# Patient Record
Sex: Female | Born: 1960 | Race: Black or African American | Hispanic: No | Smoking: Never smoker
Health system: Southern US, Community
[De-identification: ages and names within clinical notes are randomized; demographics above are authoritative.]

## PROBLEM LIST (undated history)

## (undated) DIAGNOSIS — E119 Type 2 diabetes mellitus without complications: Secondary | ICD-10-CM

## (undated) DIAGNOSIS — J45909 Unspecified asthma, uncomplicated: Secondary | ICD-10-CM

## (undated) DIAGNOSIS — T7840XA Allergy, unspecified, initial encounter: Secondary | ICD-10-CM

## (undated) DIAGNOSIS — D649 Anemia, unspecified: Secondary | ICD-10-CM

## (undated) DIAGNOSIS — R011 Cardiac murmur, unspecified: Secondary | ICD-10-CM

## (undated) DIAGNOSIS — K219 Gastro-esophageal reflux disease without esophagitis: Secondary | ICD-10-CM

## (undated) DIAGNOSIS — I10 Essential (primary) hypertension: Secondary | ICD-10-CM

## (undated) HISTORY — PX: TUBAL LIGATION: SHX77

## (undated) HISTORY — DX: Allergy, unspecified, initial encounter: T78.40XA

---

## 1993-12-03 HISTORY — PX: TUBAL LIGATION: SHX77

## 2003-10-22 ENCOUNTER — Other Ambulatory Visit: Payer: Self-pay

## 2004-12-18 ENCOUNTER — Ambulatory Visit: Payer: Self-pay | Admitting: Family Medicine

## 2005-10-01 ENCOUNTER — Emergency Department: Payer: Self-pay | Admitting: Emergency Medicine

## 2006-01-07 ENCOUNTER — Ambulatory Visit: Payer: Self-pay | Admitting: Psychiatry

## 2006-01-09 ENCOUNTER — Ambulatory Visit: Payer: Self-pay | Admitting: Family Medicine

## 2007-04-09 ENCOUNTER — Ambulatory Visit: Payer: Self-pay | Admitting: Family Medicine

## 2007-06-12 ENCOUNTER — Ambulatory Visit: Payer: Self-pay | Admitting: Family Medicine

## 2007-07-29 ENCOUNTER — Ambulatory Visit: Payer: Self-pay | Admitting: Family Medicine

## 2008-09-22 ENCOUNTER — Ambulatory Visit: Payer: Self-pay | Admitting: Family Medicine

## 2008-10-28 ENCOUNTER — Emergency Department: Payer: Self-pay | Admitting: Emergency Medicine

## 2009-02-02 ENCOUNTER — Ambulatory Visit: Payer: Self-pay | Admitting: Family Medicine

## 2010-06-03 ENCOUNTER — Emergency Department: Payer: Self-pay | Admitting: Emergency Medicine

## 2010-09-16 ENCOUNTER — Emergency Department: Payer: Self-pay | Admitting: Emergency Medicine

## 2010-11-28 ENCOUNTER — Ambulatory Visit: Payer: Self-pay | Admitting: Obstetrics & Gynecology

## 2010-11-30 ENCOUNTER — Ambulatory Visit: Payer: Self-pay | Admitting: Obstetrics & Gynecology

## 2010-12-05 LAB — PATHOLOGY REPORT

## 2011-12-04 HISTORY — PX: UTERINE FIBROID SURGERY: SHX826

## 2012-03-27 ENCOUNTER — Ambulatory Visit: Payer: Self-pay | Admitting: Family Medicine

## 2013-06-01 ENCOUNTER — Ambulatory Visit: Payer: Self-pay | Admitting: Family Medicine

## 2013-07-05 ENCOUNTER — Emergency Department: Payer: Self-pay | Admitting: Emergency Medicine

## 2014-03-05 ENCOUNTER — Emergency Department: Payer: Self-pay | Admitting: Emergency Medicine

## 2014-03-05 LAB — COMPREHENSIVE METABOLIC PANEL
ALBUMIN: 3.9 g/dL (ref 3.4–5.0)
ALK PHOS: 251 U/L — AB
ANION GAP: 7 (ref 7–16)
AST: 823 U/L — AB (ref 15–37)
BUN: 6 mg/dL — ABNORMAL LOW (ref 7–18)
Bilirubin,Total: 4.1 mg/dL — ABNORMAL HIGH (ref 0.2–1.0)
CALCIUM: 9.3 mg/dL (ref 8.5–10.1)
Chloride: 102 mmol/L (ref 98–107)
Co2: 26 mmol/L (ref 21–32)
Creatinine: 0.84 mg/dL (ref 0.60–1.30)
Glucose: 122 mg/dL — ABNORMAL HIGH (ref 65–99)
OSMOLALITY: 269 (ref 275–301)
Potassium: 3.8 mmol/L (ref 3.5–5.1)
SGPT (ALT): 1105 U/L — ABNORMAL HIGH (ref 12–78)
Sodium: 135 mmol/L — ABNORMAL LOW (ref 136–145)
Total Protein: 8.2 g/dL (ref 6.4–8.2)

## 2014-03-05 LAB — URINALYSIS, COMPLETE
BACTERIA: NONE SEEN
Blood: NEGATIVE
Glucose,UR: NEGATIVE mg/dL (ref 0–75)
Ketone: NEGATIVE
Leukocyte Esterase: NEGATIVE
Nitrite: NEGATIVE
Ph: 5 (ref 4.5–8.0)
Protein: 30
RBC,UR: 2 /HPF (ref 0–5)
SPECIFIC GRAVITY: 1.025 (ref 1.003–1.030)
Squamous Epithelial: 8
WBC UR: 8 /HPF (ref 0–5)

## 2014-03-05 LAB — CBC WITH DIFFERENTIAL/PLATELET
BASOS PCT: 0.3 %
Basophil #: 0 10*3/uL (ref 0.0–0.1)
EOS ABS: 0.2 10*3/uL (ref 0.0–0.7)
Eosinophil %: 3 %
HCT: 42.9 % (ref 35.0–47.0)
HGB: 13.8 g/dL (ref 12.0–16.0)
LYMPHS ABS: 0.6 10*3/uL — AB (ref 1.0–3.6)
Lymphocyte %: 7.5 %
MCH: 27.6 pg (ref 26.0–34.0)
MCHC: 32.3 g/dL (ref 32.0–36.0)
MCV: 85 fL (ref 80–100)
Monocyte #: 0.4 x10 3/mm (ref 0.2–0.9)
Monocyte %: 4.5 %
NEUTROS ABS: 6.9 10*3/uL — AB (ref 1.4–6.5)
Neutrophil %: 84.7 %
Platelet: 222 10*3/uL (ref 150–440)
RBC: 5.02 10*6/uL (ref 3.80–5.20)
RDW: 13.8 % (ref 11.5–14.5)
WBC: 8.2 10*3/uL (ref 3.6–11.0)

## 2014-03-05 LAB — PREGNANCY, URINE: PREGNANCY TEST, URINE: NEGATIVE m[IU]/mL

## 2014-03-05 LAB — LIPASE, BLOOD: Lipase: 211 U/L (ref 73–393)

## 2014-03-06 ENCOUNTER — Encounter (HOSPITAL_COMMUNITY): Payer: Self-pay | Admitting: *Deleted

## 2014-03-06 ENCOUNTER — Inpatient Hospital Stay (HOSPITAL_COMMUNITY)
Admission: EM | Admit: 2014-03-06 | Discharge: 2014-03-08 | DRG: 418 | Disposition: A | Discharge status: Home / Self Care | Payer: Medicaid Other | Source: Other Acute Inpatient Hospital | Attending: Internal Medicine | Admitting: Internal Medicine

## 2014-03-06 DIAGNOSIS — K8050 Calculus of bile duct without cholangitis or cholecystitis without obstruction: Secondary | ICD-10-CM | POA: Diagnosis present

## 2014-03-06 DIAGNOSIS — K810 Acute cholecystitis: Secondary | ICD-10-CM

## 2014-03-06 DIAGNOSIS — K8043 Calculus of bile duct with acute cholecystitis with obstruction: Principal | ICD-10-CM | POA: Diagnosis present

## 2014-03-06 DIAGNOSIS — K8020 Calculus of gallbladder without cholecystitis without obstruction: Secondary | ICD-10-CM

## 2014-03-06 DIAGNOSIS — Z79899 Other long term (current) drug therapy: Secondary | ICD-10-CM

## 2014-03-06 DIAGNOSIS — J45901 Unspecified asthma with (acute) exacerbation: Secondary | ICD-10-CM

## 2014-03-06 DIAGNOSIS — I152 Hypertension secondary to endocrine disorders: Secondary | ICD-10-CM | POA: Diagnosis present

## 2014-03-06 DIAGNOSIS — K219 Gastro-esophageal reflux disease without esophagitis: Secondary | ICD-10-CM | POA: Diagnosis present

## 2014-03-06 DIAGNOSIS — R7402 Elevation of levels of lactic acid dehydrogenase (LDH): Secondary | ICD-10-CM

## 2014-03-06 DIAGNOSIS — R740 Nonspecific elevation of levels of transaminase and lactic acid dehydrogenase [LDH]: Secondary | ICD-10-CM

## 2014-03-06 DIAGNOSIS — R7401 Elevation of levels of liver transaminase levels: Secondary | ICD-10-CM | POA: Diagnosis present

## 2014-03-06 DIAGNOSIS — K8040 Calculus of bile duct with cholecystitis, unspecified, without obstruction: Secondary | ICD-10-CM

## 2014-03-06 DIAGNOSIS — I10 Essential (primary) hypertension: Secondary | ICD-10-CM | POA: Diagnosis present

## 2014-03-06 DIAGNOSIS — K8051 Calculus of bile duct without cholangitis or cholecystitis with obstruction: Secondary | ICD-10-CM | POA: Diagnosis present

## 2014-03-06 HISTORY — DX: Unspecified asthma, uncomplicated: J45.909

## 2014-03-06 HISTORY — DX: Gastro-esophageal reflux disease without esophagitis: K21.9

## 2014-03-06 HISTORY — DX: Essential (primary) hypertension: I10

## 2014-03-06 HISTORY — DX: Cardiac murmur, unspecified: R01.1

## 2014-03-06 HISTORY — DX: Anemia, unspecified: D64.9

## 2014-03-06 LAB — COMPREHENSIVE METABOLIC PANEL
ALBUMIN: 3.2 g/dL — AB (ref 3.5–5.2)
ALK PHOS: 208 U/L — AB (ref 39–117)
ALT: 808 U/L — ABNORMAL HIGH (ref 0–35)
AST: 529 U/L — AB (ref 0–37)
BILIRUBIN TOTAL: 4 mg/dL — AB (ref 0.3–1.2)
BUN: 5 mg/dL — ABNORMAL LOW (ref 6–23)
CHLORIDE: 103 meq/L (ref 96–112)
CO2: 21 meq/L (ref 19–32)
Calcium: 9.3 mg/dL (ref 8.4–10.5)
Creatinine, Ser: 0.59 mg/dL (ref 0.50–1.10)
GFR calc Af Amer: >90 mL/min (ref >90)
GFR calc non Af Amer: >90 mL/min (ref >90)
Glucose, Bld: 109 mg/dL — ABNORMAL HIGH (ref 70–99)
Potassium: 4 mEq/L (ref 3.7–5.3)
Sodium: 140 mEq/L (ref 137–147)
Total Protein: 6.8 g/dL (ref 6.0–8.3)

## 2014-03-06 LAB — CBC
HEMATOCRIT: 39 % (ref 36.0–46.0)
Hemoglobin: 12.9 g/dL (ref 12.0–15.0)
MCH: 28.1 pg (ref 26.0–34.0)
MCHC: 33.1 g/dL (ref 30.0–36.0)
MCV: 85 fL (ref 78.0–100.0)
PLATELETS: 213 10*3/uL (ref 150–400)
RBC: 4.59 MIL/uL (ref 3.87–5.11)
RDW: 13.9 % (ref 11.5–15.5)
WBC: 5.2 10*3/uL (ref 4.0–10.5)

## 2014-03-06 LAB — HEPATITIS B SURFACE ANTIGEN: Hepatitis B Surface Ag: NEGATIVE

## 2014-03-06 LAB — LIPASE, BLOOD: Lipase: 63 U/L — ABNORMAL HIGH (ref 11–59)

## 2014-03-06 LAB — HEPATITIS C ANTIBODY: HCV Ab: NEGATIVE

## 2014-03-06 MED ORDER — MONTELUKAST SODIUM 10 MG PO TABS
10.0000 mg | ORAL_TABLET | Freq: Every day | ORAL | Status: DC
  Administered 2014-03-06 – 2014-03-07 (×2): 10 mg via ORAL
  Filled 2014-03-06 (×3): qty 1

## 2014-03-06 MED ORDER — TRAZODONE HCL 50 MG PO TABS
50.0000 mg | ORAL_TABLET | Freq: Every day | ORAL | Status: DC
  Administered 2014-03-06: 50 mg via ORAL
  Filled 2014-03-06 (×3): qty 1

## 2014-03-06 MED ORDER — CIPROFLOXACIN IN D5W 400 MG/200ML IV SOLN
400.0000 mg | Freq: Two times a day (BID) | INTRAVENOUS | Status: DC
  Administered 2014-03-06 – 2014-03-08 (×4): 400 mg via INTRAVENOUS
  Filled 2014-03-06 (×6): qty 200

## 2014-03-06 MED ORDER — METRONIDAZOLE IN NACL 5-0.79 MG/ML-% IV SOLN
500.0000 mg | Freq: Three times a day (TID) | INTRAVENOUS | Status: DC
  Administered 2014-03-06 – 2014-03-08 (×7): 500 mg via INTRAVENOUS
  Filled 2014-03-06 (×8): qty 100

## 2014-03-06 MED ORDER — HYDROCHLOROTHIAZIDE 12.5 MG PO CAPS
12.5000 mg | ORAL_CAPSULE | Freq: Every day | ORAL | Status: DC
  Administered 2014-03-06 – 2014-03-08 (×3): 12.5 mg via ORAL
  Filled 2014-03-06 (×3): qty 1

## 2014-03-06 MED ORDER — SODIUM CHLORIDE 0.9 % IV SOLN
INTRAVENOUS | Status: DC
  Administered 2014-03-06: 03:00:00 via INTRAVENOUS

## 2014-03-06 MED ORDER — LORATADINE 10 MG PO TABS
10.0000 mg | ORAL_TABLET | Freq: Every day | ORAL | Status: DC
  Administered 2014-03-06 – 2014-03-08 (×3): 10 mg via ORAL
  Filled 2014-03-06 (×3): qty 1

## 2014-03-06 MED ORDER — FLUTICASONE PROPIONATE HFA 44 MCG/ACT IN AERO
1.0000 | INHALATION_SPRAY | Freq: Two times a day (BID) | RESPIRATORY_TRACT | Status: DC
  Administered 2014-03-06 – 2014-03-08 (×4): 1 via RESPIRATORY_TRACT
  Filled 2014-03-06: qty 10.6

## 2014-03-06 MED ORDER — LISINOPRIL 10 MG PO TABS
10.0000 mg | ORAL_TABLET | Freq: Every day | ORAL | Status: DC
  Administered 2014-03-06 – 2014-03-08 (×3): 10 mg via ORAL
  Filled 2014-03-06 (×3): qty 1

## 2014-03-06 MED ORDER — FERROUS SULFATE 325 (65 FE) MG PO TABS
325.0000 mg | ORAL_TABLET | Freq: Every day | ORAL | Status: DC
  Administered 2014-03-06 – 2014-03-08 (×3): 325 mg via ORAL
  Filled 2014-03-06 (×4): qty 1

## 2014-03-06 MED ORDER — ONDANSETRON HCL 4 MG/2ML IJ SOLN: 4.0000 mg | Freq: Four times a day (QID) | INTRAMUSCULAR | Status: DC | PRN

## 2014-03-06 MED ORDER — HEPARIN SODIUM (PORCINE) 5000 UNIT/ML IJ SOLN
5000.0000 [IU] | Freq: Three times a day (TID) | INTRAMUSCULAR | Status: AC
  Administered 2014-03-06: 5000 [IU] via SUBCUTANEOUS
  Filled 2014-03-06 (×4): qty 1

## 2014-03-06 MED ORDER — MORPHINE SULFATE 2 MG/ML IJ SOLN
2.0000 mg | INTRAMUSCULAR | Status: DC | PRN
  Administered 2014-03-07 (×2): 4 mg via INTRAVENOUS
  Filled 2014-03-06 (×2): qty 2

## 2014-03-06 MED ORDER — FAMOTIDINE IN NACL 20-0.9 MG/50ML-% IV SOLN
20.0000 mg | Freq: Two times a day (BID) | INTRAVENOUS | Status: DC
  Administered 2014-03-06 – 2014-03-08 (×5): 20 mg via INTRAVENOUS
  Filled 2014-03-06 (×7): qty 50

## 2014-03-06 NOTE — Progress Notes (Signed)
Patient ID: Stacy Dixon, female   DOB: August 09, 1961, 53 y.o.   MRN: [id number] [facility] Progress Note:   * No surgery date entered *  Subjective: Mental status is clear.  Patient aware of upcoming ERCP and lap chole Objective: Vital signs in last 24 hours: Temp:  [98 F (36.7 C)-98.5 F (36.9 C)] 98 F (36.7 C) (04/04 0556) Pulse Rate:  [83-91] 83 (04/04 0556) Resp:  [18] 18 (04/04 0556) BP: (101-140)/(69-82) 101/69 mmHg (04/04 1200) SpO2:  [97 %-98 %] 98 % (04/04 0556) Weight:  [200 lb 6.4 oz (90.9 kg)] 200 lb 6.4 oz (90.9 kg) (04/04 0200)  Intake/Output from previous day:   Intake/Output this shift: Total I/O In: -  Out: 1 [Urine:1]  Physical Exam: Work of breathing is normal.  Not complaining of any abdominal pain  Lab Results:  Results for orders placed during the hospital encounter of 03/06/14 (from the past 48 hour(s))  CBC     Status: None   Collection Time    03/06/14  6:00 AM      Result Value Ref Range   WBC 5.2  4.0 - 10.5 K/uL   RBC 4.59  3.87 - 5.11 MIL/uL   Hemoglobin 12.9  12.0 - 15.0 g/dL   HCT 39.0  36.0 - 46.0 %   MCV 85.0  78.0 - 100.0 fL   MCH 28.1  26.0 - 34.0 pg   MCHC 33.1  30.0 - 36.0 g/dL   RDW 13.9  11.5 - 15.5 %   Platelets 213  150 - 400 K/uL  COMPREHENSIVE METABOLIC PANEL     Status: Abnormal   Collection Time    03/06/14  6:00 AM      Result Value Ref Range   Sodium 140  137 - 147 mEq/L   Potassium 4.0  3.7 - 5.3 mEq/L   Chloride 103  96 - 112 mEq/L   CO2 21  19 - 32 mEq/L   Glucose, Bld 109 (*) 70 - 99 mg/dL   BUN 5 (*) 6 - 23 mg/dL   Creatinine, Ser 0.59  0.50 - 1.10 mg/dL   Calcium 9.3  8.4 - 10.5 mg/dL   Total Protein 6.8  6.0 - 8.3 g/dL   Albumin 3.2 (*) 3.5 - 5.2 g/dL   AST 529 (*) 0 - 37 U/L   ALT 808 (*) 0 - 35 U/L   Alkaline Phosphatase 208 (*) 39 - 117 U/L   Total Bilirubin 4.0 (*) 0.3 - 1.2 mg/dL   GFR calc non Af Amer >90  >90 mL/min   GFR calc Af Amer >90  >90 mL/min   Comment: (NOTE)     The  eGFR has been calculated using the CKD EPI equation.     This calculation has not been validated in all clinical situations.     eGFR's persistently <90 mL/min signify possible Chronic Kidney     Disease.  LIPASE, BLOOD     Status: Abnormal   Collection Time    03/06/14  6:00 AM      Result Value Ref Range   Lipase 63 (*) 11 - 59 U/L    Radiology/Results: No results found.  Anti-infectives: Anti-infectives   Start     Dose/Rate Route Frequency Ordered Stop   03/06/14 1100  ciprofloxacin (CIPRO) IVPB 400 mg     400 mg 200 mL/hr over 60 Minutes Intravenous Every 12 hours 03/06/14 0954     03/06/14 1100  metroNIDAZOLE (FLAGYL) IVPB  500 mg     500 mg 100 mL/hr over 60 Minutes Intravenous Every 8 hours 03/06/14 0954        Assessment/Plan: Problem List: Patient Active Problem List   Diagnosis Date Noted  . Choledocholithiasis with obstruction 03/06/2014  . Transaminitis 03/06/2014  . Cholelithiasis 03/06/2014  . HTN (hypertension) 03/06/2014  . Unspecified asthma, with exacerbation 03/06/2014    For ERCP by Dr. Ardis Hughs in am in [place] followed by Lap chole at same setting. I have discussed lap chole with her in some detail.   * No surgery date entered *    LOS: 0 days   Matt B. Hassell Done, MD, [facility] [phone] beeper [phone]  03/06/2014 12:18 PM

## 2014-03-06 NOTE — Consult Note (Signed)
Agree with above, need us report at least from [place] or repeat this, discussed lap chole after ercp tomorrow.

## 2014-03-06 NOTE — Progress Notes (Signed)
Pt admitted to unit at 0148, pt alert and oriented. IV intact, skin intact. No current complaints of nausea/vomiting. Pt rates pain "6" out of 10 states better than before. [facility]. Pt oriented to room and call bell placed within reach.

## 2014-03-06 NOTE — Consult Note (Signed)
Reason for Consult: Cholelithiasis/cholecystitis Referring Physician: Dr. Owens Loffler  Stacy Dixon is an 53 y.o. female.  HPI: pt reports being in good health started having pain RUQ and mid abdomen going to her back, evening 03/04/14.  No nausea or vomiting just pain.  She had some diarrhea that evening.  Pain persisted, but she went to work anyway on 03/05/14.  She continued to have pain thru the day, again no nausea or vomiting.  She went home and family had her to [facility], for evaluation.  We do not have the information here, but she was found to have elevated LFT'S, and abdominal US shows CBD dilatation.  WBC is normal.  They do not have ERCP coverage in [place] so she was transferred to [facility]. Currently she does not have nausea, vomiting or diarrhea, she still has pain and tenderness RUQ, and mid abdomen.  Past Medical History  Diagnosis Date  . Asthma   . Hypertension   . Heart murmur     Some question of a history of A.Fib, but on no control meds and patient is in NSR on ED EKG  . GERD (gastroesophageal reflux disease)   . Anemia     Past Surgical History  Procedure Laterality Date    C section x 2    . Uterine fibroid surgery  2013  . Tubal ligation  1995    No family history on file.  Social History:  reports that she has never smoked. She does not have any smokeless tobacco history on file. She reports that she drinks alcohol. She reports that she does not use illicit drugs.  Allergies:  Allergies  Allergen Reactions  . Prednisone Anaphylaxis    Medications:  Prior to Admission:  Prescriptions prior to admission  Medication Sig Dispense Refill  . albuterol (PROVENTIL HFA;VENTOLIN HFA) 108 (90 BASE) MCG/ACT inhaler Inhale 2 puffs into the lungs every 6 (six) hours as needed for wheezing or shortness of breath.      . beclomethasone (QVAR) 80 MCG/ACT inhaler Inhale 2 puffs into the lungs daily as needed (for wheezing and shortness of breath).      . cetirizine  (ZYRTEC) 10 MG tablet Take 10 mg by mouth daily.      . IRON PO Take 1 tablet by mouth daily.      Marland Kitchen lisinopril-hydrochlorothiazide (PRINZIDE,ZESTORETIC) 20-25 MG per tablet Take 1 tablet by mouth daily.      . montelukast (SINGULAIR) 10 MG tablet Take 10 mg by mouth at bedtime.      . ranitidine (ZANTAC) 300 MG tablet Take 300 mg by mouth at bedtime.      . simvastatin (ZOCOR) 40 MG tablet Take 40 mg by mouth every evening.      . traZODone (DESYREL) 50 MG tablet Take 50 mg by mouth at bedtime as needed for sleep.       Scheduled: . ciprofloxacin  400 mg Intravenous Q12H  . ferrous sulfate  325 mg Oral Q breakfast  . fluticasone  1 puff Inhalation BID  . heparin  5,000 Units Subcutaneous 3 times per day  . hydrochlorothiazide  12.5 mg Oral Daily  . lisinopril  10 mg Oral Daily  . loratadine  10 mg Oral Daily  . metronidazole  500 mg Intravenous Q8H  . montelukast  10 mg Oral QHS  . traZODone  50 mg Oral QHS   Continuous: . sodium chloride 75 mL/hr at 03/06/14 0313   AUQ:[id number] injection, ondansetron Anti-infectives   Start  Dose/Rate Route Frequency Ordered Stop   03/06/14 1100  ciprofloxacin (CIPRO) IVPB 400 mg     400 mg 200 mL/hr over 60 Minutes Intravenous Every 12 hours 03/06/14 0954     03/06/14 1100  metroNIDAZOLE (FLAGYL) IVPB 500 mg     500 mg 100 mL/hr over 60 Minutes Intravenous Every 8 hours 03/06/14 0954        Results for orders placed during the hospital encounter of 03/06/14 (from the past 48 hour(s))  CBC     Status: None   Collection Time    03/06/14  6:00 AM      Result Value Ref Range   WBC 5.2  4.0 - 10.5 K/uL   RBC 4.59  3.87 - 5.11 MIL/uL   Hemoglobin 12.9  12.0 - 15.0 g/dL   HCT 39.0  36.0 - 46.0 %   MCV 85.0  78.0 - 100.0 fL   MCH 28.1  26.0 - 34.0 pg   MCHC 33.1  30.0 - 36.0 g/dL   RDW 13.9  11.5 - 15.5 %   Platelets 213  150 - 400 K/uL  COMPREHENSIVE METABOLIC PANEL     Status: Abnormal   Collection Time    03/06/14  6:00 AM       Result Value Ref Range   Sodium 140  137 - 147 mEq/L   Potassium 4.0  3.7 - 5.3 mEq/L   Chloride 103  96 - 112 mEq/L   CO2 21  19 - 32 mEq/L   Glucose, Bld 109 (*) 70 - 99 mg/dL   BUN 5 (*) 6 - 23 mg/dL   Creatinine, Ser 0.59  0.50 - 1.10 mg/dL   Calcium 9.3  8.4 - 10.5 mg/dL   Total Protein 6.8  6.0 - 8.3 g/dL   Albumin 3.2 (*) 3.5 - 5.2 g/dL   AST 529 (*) 0 - 37 U/L   ALT 808 (*) 0 - 35 U/L   Alkaline Phosphatase 208 (*) 39 - 117 U/L   Total Bilirubin 4.0 (*) 0.3 - 1.2 mg/dL   GFR calc non Af Amer >90  >90 mL/min   GFR calc Af Amer >90  >90 mL/min   Comment: (NOTE)     The eGFR has been calculated using the CKD EPI equation.     This calculation has not been validated in all clinical situations.     eGFR's persistently <90 mL/min signify possible Chronic Kidney     Disease.  LIPASE, BLOOD     Status: Abnormal   Collection Time    03/06/14  6:00 AM      Result Value Ref Range   Lipase 63 (*) 11 - 59 U/L    No results found.  Review of Systems  Constitutional: Positive for chills. Negative for fever, weight loss, malaise/fatigue and diaphoresis.  HENT: Negative.   Eyes: Negative.   Respiratory: Negative.   Cardiovascular: Negative.   Gastrointestinal: Positive for heartburn and diarrhea. Negative for nausea, vomiting, constipation, blood in stool and melena. Abdominal pain: pain mid and RUQ to back, acute onset.  Genitourinary: Negative.   Musculoskeletal: Positive for back pain (back pain with abdominal pain this is the first time her back has ever given her a problem,).  Skin: Negative.   Neurological: Negative.  Negative for weakness.   Blood pressure 101/69, pulse 83, temperature 98 F (36.7 C), temperature source Oral, resp. rate 18, height 5' 2"  (1.575 m), weight 90.9 kg (200 lb 6.4 oz), last menstrual period  02/11/2014, SpO2 98.00%. Physical Exam  Constitutional: She is oriented to person, place, and time. She appears well-developed and well-nourished. No  distress.  HENT:  Head: Normocephalic and atraumatic.  Nose: Nose normal.  Eyes: Conjunctivae and EOM are normal. Pupils are equal, round, and reactive to light. Right eye exhibits no discharge. Left eye exhibits no discharge. No scleral icterus.  Neck: Normal range of motion. Neck supple. No JVD present. No tracheal deviation present. No thyromegaly present.  Cardiovascular: Normal rate, regular rhythm, normal heart sounds and intact distal pulses.  Exam reveals no gallop.   No murmur heard. Respiratory: Effort normal and breath sounds normal. No respiratory distress. She has no wheezes. She has no rales. She exhibits no tenderness.  GI: Soft. Bowel sounds are normal. She exhibits no distension and no mass. There is tenderness (Tender RUQ and mid upper abdomen). There is no rebound and no guarding.  Musculoskeletal: She exhibits no edema.  Lymphadenopathy:    She has no cervical adenopathy.  Neurological: She is alert and oriented to person, place, and time. No cranial nerve deficit.  Skin: Skin is warm and dry. No rash noted. She is not diaphoretic. No erythema. No pallor.  Psychiatric: She has a normal mood and affect. Her behavior is normal. Judgment and thought content normal.    Assessment/Plan: Cholelithiasis, cholecystitis, and choledocholithiasis Hx of asthma Hx of murmur GERD Anemia Body mass index is 36.6  Plan:  Recheck labs in AM and plan on ERCP and cholecystectomy tomorrow.  She has had abdominal surgery in the past.  Continue hydration and antibiotics.    Elian Gloster 03/06/2014, 12:46 PM

## 2014-03-06 NOTE — Progress Notes (Addendum)
    [facility] PROGRESS NOTE  Stacy Dixon ZOX:[id number] DOB: October 11, 1961 DOA: 03/06/2014 PCP: [name], NZINGHA J, DO  Assessment/Plan: Choledocholithiasis -03/05/2014 abdominal ultrasound @[facility]  showed extrahepatic ductal dilatation with common bile duct 11 mm -Concerned about cholecystitis, +Murphy's -Consulted GI and general surgery -Start empiric antibiotics -Continue IV fluids Transaminasemia -concerned about cholecystitis -check hep b surface antigen, hep c antibody -Trending down Hypertension -Continue lisinopril and HCTZ -Controlled Asthma -Stable -Continue Flovent -Continue Singulair   Antibiotics:  cipro 03/06/14>>>  Flagyl 03/06/14>>>    Procedures/Studies:  No results found.      Subjective: Patient states that abdominal pain is better than yesterday. No vomiting since admission. Denies any fevers, chills, chest discomfort, shortness breath, dysuria, hematuria, headache, dizziness  Objective: Filed Vitals:   03/06/14 0200 03/06/14 0556  BP: 140/82 130/78  Pulse: 91 83  Temp: 98.5 F (36.9 C) 98 F (36.7 C)  TempSrc: Oral Oral  Resp: 18 18  Height: 5\' 2"  (1.575 m)   Weight: 90.9 kg (200 lb 6.4 oz)   SpO2: 97% 98%    Intake/Output Summary (Last 24 hours) at 03/06/14 0939 Last data filed at 03/06/14 0313  Gross per 24 hour  Intake      0 ml  Output      0 ml  Net      0 ml   Weight change:  Exam:   General:  Pt is alert, follows commands appropriately, not in acute distress  HEENT: No icterus, No thrush,  [place]/AT  Cardiovascular: RRR, S1/S2, no rubs, no gallops  Respiratory: CTA bilaterally, no wheezing, no crackles, no rhonchi  Abdomen: Soft/+BS, RUQ tender, +Murphy's sign, non distended, no guarding  Extremities: No edema, No lymphangitis, No petechiae, No rashes, no synovitis  Data Reviewed: Basic Metabolic Panel:  Recent Labs Lab 03/06/14 0600  NA 140  K 4.0  CL 103  CO2 21  GLUCOSE 109*  BUN 5*  CREATININE  0.59  CALCIUM 9.3   Liver Function Tests:  Recent Labs Lab 03/06/14 0600  AST 529*  ALT 808*  ALKPHOS 208*  BILITOT 4.0*  PROT 6.8  ALBUMIN 3.2*   No results found for this basename: LIPASE, AMYLASE,  in the last 168 hours No results found for this basename: AMMONIA,  in the last 168 hours CBC:  Recent Labs Lab 03/06/14 0600  WBC 5.2  HGB 12.9  HCT 39.0  MCV 85.0  PLT 213   Cardiac Enzymes: No results found for this basename: CKTOTAL, CKMB, CKMBINDEX, TROPONINI,  in the last 168 hours BNP: No components found with this basename: POCBNP,  CBG: No results found for this basename: GLUCAP,  in the last 168 hours  No results found for this or any previous visit (from the past 240 hour(s)).   Scheduled Meds: . ferrous sulfate  325 mg Oral Q breakfast  . fluticasone  1 puff Inhalation BID  . heparin  5,000 Units Subcutaneous 3 times per day  . hydrochlorothiazide  12.5 mg Oral Daily  . lisinopril  10 mg Oral Daily  . loratadine  10 mg Oral Daily  . montelukast  10 mg Oral QHS  . traZODone  50 mg Oral QHS   Continuous Infusions: . sodium chloride 75 mL/hr at 03/06/14 0313     Kyriakos Babler, DO  [facility] Pager [phone]  If 7PM-7AM, please contact night-coverage [email or web address] Password [email or web address] 03/06/2014, 9:39 AM   LOS: 0 days

## 2014-03-06 NOTE — H&P (Signed)
[facility] History and Physical  Stacy Dixon:[id number] DOB: 1961/01/16 DOA: 03/06/2014  Referring physician: EDP PCP: Creola Corn, DO   Chief Complaint: abdominal pain   HPI: Stacy Dixon is a 53 y.o. female who presents with a 2 day history of abdominal pain, N/V.  Pain located in RUQ, worse with eating, hasnt noticed a difference with different types of foods.  Nothing seems to make pain better.  Pain radiates around to her back.  She [facility] to [facility] for her symptoms.  Work up in [facility] demonstrates transaminitis with AST of 1105, ALT 823, Tbili 4.1, Alk phos 251.  Lipase was WNL.  RUQ ultrasound demonstrates extrahepatic biliary duct dilatation.  CBD dilation of up to 83m.  Mid to distal common bile duct are obscured by gas.  Suspect mass or calculus in mid to distal CBD, and they recommend ERCP.  [facility]  Review of Systems: Systems reviewed.  As above, otherwise negative  Past Medical History  Diagnosis Date  . Asthma   . Hypertension   . Heart murmur   . GERD (gastroesophageal reflux disease)   . Anemia      Past Surgical History  Procedure Laterality Date  . Uterine fibroid surgery  2013  . Tubal ligation  1995   Social History:  reports that she has never smoked. She does not have any smokeless tobacco history on file. She reports that she drinks alcohol. She reports that she does not use illicit drugs.  Allergies  Allergen Reactions  . Prednisone Anaphylaxis    No family history on file.   Prior to Admission medications   Not on File   Physical Exam: Filed Vitals:   03/06/14 0200  BP: 140/82  Pulse: 91  Temp: 98.5 F (36.9 C)  Resp: 18    BP 140/82  Pulse 91  Temp(Src) 98.5 F (36.9 C) (Oral)  Resp 18  Ht 5' 2"  (1.575 m)  Wt 90.9 kg (200 lb 6.4 oz)  BMI 36.64 kg/m2  SpO2 97%  LMP 02/11/2014  General Appearance:    Alert, oriented, no distress, appears  stated age  Head:    Normocephalic, atraumatic  Eyes:    PERRL, EOMI, sclera non-icteric        Nose:   Nares without drainage or epistaxis. Mucosa, turbinates normal  Throat:   Moist mucous membranes. Oropharynx without erythema or exudate.  Neck:   Supple. No carotid bruits.  No thyromegaly.  No lymphadenopathy.   Back:     No CVA tenderness, no spinal tenderness  Lungs:     Clear to auscultation bilaterally, without wheezes, rhonchi or rales  Chest wall:    No tenderness to palpitation  Heart:    Regular rate and rhythm without murmurs, gallops, rubs  Abdomen:     Soft, RUQ tenderness, nondistended, normal bowel sounds, no organomegaly  Genitalia:    deferred  Rectal:    deferred  Extremities:   No clubbing, cyanosis or edema.  Pulses:   2+ and symmetric all extremities  Skin:   Skin color, texture, turgor normal, no rashes or lesions  Lymph nodes:   Cervical, supraclavicular, and axillary nodes normal  Neurologic:   CNII-XII intact. Normal strength, sensation and reflexes      throughout    Labs on Admission:  Basic Metabolic Panel: No results found for this basename: NA, K, CL, CO2, GLUCOSE, BUN, CREATININE, CALCIUM, MG, PHOS,  in the last 168 hours Liver Function Tests: No results found for this basename: AST, ALT, ALKPHOS, BILITOT, PROT, ALBUMIN,  in the last 168 hours No results found for this basename: LIPASE, AMYLASE,  in the last 168 hours No results found for this basename: AMMONIA,  in the last 168 hours CBC: No results found for this basename: WBC, NEUTROABS, HGB, HCT, MCV, PLT,  in the last 168 hours Cardiac Enzymes: No results found for this basename: CKTOTAL, CKMB, CKMBINDEX, TROPONINI,  in the last 168 hours  BNP (last 3 results) No results found for this basename: PROBNP,  in the last 8760 hours CBG: No results found for this basename: GLUCAP,  in the last 168 hours  Radiological Exams on Admission: No results found.  EKG: Independently  reviewed.  Assessment/Plan Principal Problem:   Choledocholithiasis with obstruction Active Problems:   Transaminitis   Cholelithiasis   HTN (hypertension)   1. Choledocholithiasis with obstruction - consult GI in AM, and anticipate ERCP tomorrow.  Patient NPO, morphine for pain, patient is quite comfortable right now in room.  No evidence of pancreatitis at this time. 2. Transaminitis - follow with repeat CMP in AM, anticipate that this will improve after removal of stone via ERCP. 3. Cholelithiasis - likely will require cholecystectomy after problem #1 resolved, consult general surgery later during admit. 4. HTN - continue home meds.    Code Status: Full Code  Family Communication: No family in room Disposition Plan: Admit to inpatient   Time spent: 70 min  Jaleya Pebley M. [facility] Pager [phone]  If 7AM-7PM, please contact the day team taking care of the patient [email or web address] Password [email or web address] 03/06/2014, 2:59 AM

## 2014-03-06 NOTE — Consult Note (Signed)
Consultation  Referring Provider: [facility]     Primary Care Physician:  WHITE, Corliss Marcus, DO Primary Gastroenterologist: none        Reason for Consultation:  choledocholithiasis          HPI:   Stacy Dixon is a 53 y.o. female transferred from [facility] today with choledocholithiasis. Patient developed RUQ pain radiating through to back. No associated nausea but + chills. She has never had this pain before. Patient went to ED. LFTs elevated in mixed pattern. CBC normal, afebrile. I don't have records but according to H+P her RUQ ultrasound demonstrated CBD dilation. No ERCP coverage in [place] so she was transferred here.   Past Medical History  Diagnosis Date  . Asthma   . Hypertension   . Heart murmur     Some question of a history of A.Fib, but on no control meds and patient is in NSR on ED EKG  . GERD (gastroesophageal reflux disease)   . Anemia     Past Surgical History  Procedure Laterality Date  . Uterine fibroid surgery  2013  . Tubal ligation  1995    FMH: no liver diseases. No GI malignancies. Mother had COPD. Father died of MI.  History  Substance Use Topics  . Smoking status: Never Smoker   . Smokeless tobacco: Not on file  . Alcohol Use: Yes     Comment: socially    Prior to Admission medications   Medication Sig Start Date End Date Taking? Authorizing Provider  albuterol (PROVENTIL HFA;VENTOLIN HFA) 108 (90 BASE) MCG/ACT inhaler Inhale 2 puffs into the lungs every 6 (six) hours as needed for wheezing or shortness of breath.   Yes Historical Provider, MD  beclomethasone (QVAR) 80 MCG/ACT inhaler Inhale 2 puffs into the lungs daily as needed (for wheezing and shortness of breath).   Yes Historical Provider, MD  cetirizine (ZYRTEC) 10 MG tablet Take 10 mg by mouth daily.   Yes Historical Provider, MD  IRON PO Take 1 tablet by mouth daily.   Yes Historical Provider, MD  lisinopril-hydrochlorothiazide (PRINZIDE,ZESTORETIC) 20-25 MG per tablet Take  1 tablet by mouth daily.   Yes Historical Provider, MD  montelukast (SINGULAIR) 10 MG tablet Take 10 mg by mouth at bedtime.   Yes Historical Provider, MD  ranitidine (ZANTAC) 300 MG tablet Take 300 mg by mouth at bedtime.   Yes Historical Provider, MD  simvastatin (ZOCOR) 40 MG tablet Take 40 mg by mouth every evening.   Yes Historical Provider, MD  traZODone (DESYREL) 50 MG tablet Take 50 mg by mouth at bedtime as needed for sleep.   Yes Historical Provider, MD    Current Facility-Administered Medications  Medication Dose Route Frequency Provider Last Rate Last Dose  . 0.9 %  sodium chloride infusion   Intravenous Continuous Hillary Bow, DO 75 mL/hr at 03/06/14 1610    . ciprofloxacin (CIPRO) IVPB 400 mg  400 mg Intravenous Q12H David Tat, MD      . ferrous sulfate tablet 325 mg  325 mg Oral Q breakfast Hillary Bow, DO   325 mg at 03/06/14 0847  . fluticasone (FLOVENT HFA) 44 MCG/ACT inhaler 1 puff  1 puff Inhalation BID Hillary Bow, DO   1 puff at 03/06/14 0740  . heparin injection 5,000 Units  5,000 Units Subcutaneous 3 times per day Hillary Bow, DO      . hydrochlorothiazide (MICROZIDE) capsule 12.5 mg  12.5 mg Oral Daily Jilda Panda  Electa Sniff, DO      . lisinopril (PRINIVIL,ZESTRIL) tablet 10 mg  10 mg Oral Daily Hillary Bow, DO      . loratadine (CLARITIN) tablet 10 mg  10 mg Oral Daily Hillary Bow, DO      . metroNIDAZOLE (FLAGYL) IVPB 500 mg  500 mg Intravenous Q8H David Tat, MD      . montelukast (SINGULAIR) tablet 10 mg  10 mg Oral QHS Hillary Bow, DO      . morphine 2 MG/ML injection 2-4 mg  2-4 mg Intravenous Q4H PRN Hillary Bow, DO      . ondansetron [facility]) injection 4 mg  4 mg Intravenous Q6H PRN Rolan Lipa, NP      . traZODone (DESYREL) tablet 50 mg  50 mg Oral QHS Hillary Bow, DO        Allergies as of 03/05/2014  . (Not on File)   Review of Systems:    All systems reviewed and negative except where noted in HPI.   Physical  Exam:  Vital signs in last 24 hours: Temp:  [98 F (36.7 C)-98.5 F (36.9 C)] 98 F (36.7 C) (04/04 0556) Pulse Rate:  [83-91] 83 (04/04 0556) Resp:  [18] 18 (04/04 0556) BP: (130-140)/(78-82) 130/78 mmHg (04/04 0556) SpO2:  [97 %-98 %] 98 % (04/04 0556) Weight:  [200 lb 6.4 oz (90.9 kg)] 200 lb 6.4 oz (90.9 kg) (04/04 0200) Last BM Date: 03/03/14 General:   Pleasant black female in NAD Head:  Normocephalic and atraumatic. Eyes:   No icterus.   Conjunctiva pink. Ears:  Normal auditory acuity. Neck:  Supple; no masses felt Lungs:  Respirations even and unlabored. Lungs clear to auscultation bilaterally.   No wheezes, crackles, or rhonchi.  Heart:  Regular rate and rhythm Abdomen:  Soft, nondistended, mild RUQ tenderness. Normal bowel sounds. No appreciable masses or hepatomegaly.  Rectal:  Not performed.  Msk:  Symmetrical without gross deformities.  Extremities:  Without edema. Neurologic:  Alert and  oriented x4;  grossly normal neurologically. Skin:  Intact without significant lesions or rashes. Cervical Nodes:  No significant cervical adenopathy. Psych:  Alert and cooperative. Normal affect.  LAB RESULTS:  Recent Labs  03/06/14 0600  WBC 5.2  HGB 12.9  HCT 39.0  PLT 213   BMET  Recent Labs  03/06/14 0600  NA 140  K 4.0  CL 103  CO2 21  GLUCOSE 109*  BUN 5*  CREATININE 0.59  CALCIUM 9.3   LFT  Recent Labs  03/06/14 0600  PROT 6.8  ALBUMIN 3.2*  AST 529*  ALT 808*  ALKPHOS 208*  BILITOT 4.0*     Impression / Plan:   53 year old female with RUQ pain, chills, elevated LFTs and ultrasound done at [facility] revealing cholelithiasis, CBD dilation. Patient will need ERCP with probable stone extraction followed by cholecystectomy. The risks and benefits of ERCP with stone extraction / possible sphincterotomy were explained to the patient and she agrees to proceed. ERCP will be done in OR in am at time of cholecystectomy (we are arranging with surgery). Continue  IV Cipro / flagyl. NPO after midnight. Will d/c SQ heparin this evening.   Thanks   LOS: 0 days   Willette Cluster  03/06/2014, 11:36 AM    ________________________________________________________________________  [name] MD note:  I personally examined the patient, reviewed the data and agree with the assessment and plan described above.  Planning on ERCP in OR  tomorrow followed by lap chole. I spoke with OR staff, currently will be 7:30 AM case start but emergency case may bush it back if needed. OR will call me on cell [phone] if 7:30 is not going to happen.   Rob Buntinganiel Chaise Passarella, MD [facility] Pager [phone]

## 2014-03-07 ENCOUNTER — Inpatient Hospital Stay (HOSPITAL_COMMUNITY): Payer: Medicaid Other

## 2014-03-07 ENCOUNTER — Inpatient Hospital Stay (HOSPITAL_COMMUNITY): Payer: Medicaid Other | Admitting: Anesthesiology

## 2014-03-07 ENCOUNTER — Encounter (HOSPITAL_COMMUNITY): Payer: Self-pay | Admitting: Emergency Medicine

## 2014-03-07 ENCOUNTER — Encounter (HOSPITAL_COMMUNITY): Payer: Medicaid Other | Admitting: Anesthesiology

## 2014-03-07 ENCOUNTER — Encounter (HOSPITAL_COMMUNITY)
Admission: EM | Disposition: A | Discharge status: Home / Self Care | Payer: Self-pay | Source: Other Acute Inpatient Hospital | Attending: Internal Medicine

## 2014-03-07 DIAGNOSIS — K8050 Calculus of bile duct without cholangitis or cholecystitis without obstruction: Secondary | ICD-10-CM | POA: Diagnosis present

## 2014-03-07 DIAGNOSIS — K8020 Calculus of gallbladder without cholecystitis without obstruction: Secondary | ICD-10-CM

## 2014-03-07 DIAGNOSIS — K8010 Calculus of gallbladder with chronic cholecystitis without obstruction: Secondary | ICD-10-CM

## 2014-03-07 DIAGNOSIS — J45901 Unspecified asthma with (acute) exacerbation: Secondary | ICD-10-CM

## 2014-03-07 DIAGNOSIS — K810 Acute cholecystitis: Secondary | ICD-10-CM

## 2014-03-07 HISTORY — PX: CHOLECYSTECTOMY: SHX55

## 2014-03-07 HISTORY — PX: ERCP: SHX5425

## 2014-03-07 LAB — COMPREHENSIVE METABOLIC PANEL
ALK PHOS: 229 U/L — AB (ref 39–117)
ALT: 669 U/L — AB (ref 0–35)
AST: 247 U/L — ABNORMAL HIGH (ref 0–37)
Albumin: 3.6 g/dL (ref 3.5–5.2)
BILIRUBIN TOTAL: 1.4 mg/dL — AB (ref 0.3–1.2)
BUN: 6 mg/dL (ref 6–23)
CHLORIDE: 99 meq/L (ref 96–112)
CO2: 23 meq/L (ref 19–32)
Calcium: 9.8 mg/dL (ref 8.4–10.5)
Creatinine, Ser: 0.7 mg/dL (ref 0.50–1.10)
GLUCOSE: 108 mg/dL — AB (ref 70–99)
POTASSIUM: 4.2 meq/L (ref 3.7–5.3)
SODIUM: 140 meq/L (ref 137–147)
Total Protein: 7.8 g/dL (ref 6.0–8.3)

## 2014-03-07 LAB — CBC
HCT: 44.1 % (ref 36.0–46.0)
Hemoglobin: 14.7 g/dL (ref 12.0–15.0)
MCH: 28.2 pg (ref 26.0–34.0)
MCHC: 33.3 g/dL (ref 30.0–36.0)
MCV: 84.6 fL (ref 78.0–100.0)
PLATELETS: 225 10*3/uL (ref 150–400)
RBC: 5.21 MIL/uL — ABNORMAL HIGH (ref 3.87–5.11)
RDW: 13.9 % (ref 11.5–15.5)
WBC: 4 10*3/uL (ref 4.0–10.5)

## 2014-03-07 LAB — LIPASE, BLOOD: LIPASE: 140 U/L — AB (ref 11–59)

## 2014-03-07 LAB — SURGICAL PCR SCREEN
MRSA, PCR: NEGATIVE
STAPHYLOCOCCUS AUREUS: NEGATIVE

## 2014-03-07 SURGERY — ERCP, WITH INTERVENTION IF INDICATED
Anesthesia: General | Site: Abdomen

## 2014-03-07 SURGERY — ERCP, WITH INTERVENTION IF INDICATED
Anesthesia: General

## 2014-03-07 MED ORDER — BUPIVACAINE-EPINEPHRINE (PF) 0.25% -1:200000 IJ SOLN
INTRAMUSCULAR | Status: AC
  Filled 2014-03-07: qty 30

## 2014-03-07 MED ORDER — ONDANSETRON HCL 4 MG/2ML IJ SOLN
INTRAMUSCULAR | Status: AC
  Filled 2014-03-07: qty 2

## 2014-03-07 MED ORDER — ROCURONIUM BROMIDE 50 MG/5ML IV SOLN
INTRAVENOUS | Status: AC
  Filled 2014-03-07: qty 1

## 2014-03-07 MED ORDER — IOHEXOL 300 MG/ML  SOLN
INTRAMUSCULAR | Status: DC | PRN
  Administered 2014-03-07: 09:00:00

## 2014-03-07 MED ORDER — VECURONIUM BROMIDE 10 MG IV SOLR
INTRAVENOUS | Status: AC
  Filled 2014-03-07: qty 10

## 2014-03-07 MED ORDER — LACTATED RINGERS IV SOLN
INTRAVENOUS | Status: DC | PRN
  Administered 2014-03-07 (×2): via INTRAVENOUS

## 2014-03-07 MED ORDER — HYDROMORPHONE HCL PF 1 MG/ML IJ SOLN
INTRAMUSCULAR | Status: AC
  Administered 2014-03-07: 0.5 mg via INTRAVENOUS
  Filled 2014-03-07: qty 1

## 2014-03-07 MED ORDER — ONDANSETRON HCL 4 MG/2ML IJ SOLN
INTRAMUSCULAR | Status: DC | PRN
  Administered 2014-03-07: 4 mg via INTRAVENOUS

## 2014-03-07 MED ORDER — SODIUM CHLORIDE 0.9 % IR SOLN
Status: DC | PRN
  Administered 2014-03-07: 1000 mL

## 2014-03-07 MED ORDER — LIDOCAINE HCL (CARDIAC) 20 MG/ML IV SOLN
INTRAVENOUS | Status: DC | PRN
  Administered 2014-03-07: 40 mg via INTRAVENOUS

## 2014-03-07 MED ORDER — FENTANYL CITRATE 0.05 MG/ML IJ SOLN
INTRAMUSCULAR | Status: AC
  Filled 2014-03-07: qty 5

## 2014-03-07 MED ORDER — GLUCAGON HCL (RDNA) 1 MG IJ SOLR
INTRAMUSCULAR | Status: AC
  Filled 2014-03-07: qty 1

## 2014-03-07 MED ORDER — BUPIVACAINE-EPINEPHRINE 0.25% -1:200000 IJ SOLN
INTRAMUSCULAR | Status: DC | PRN
  Administered 2014-03-07: 20 mL

## 2014-03-07 MED ORDER — PROPOFOL 10 MG/ML IV BOLUS
INTRAVENOUS | Status: AC
  Filled 2014-03-07: qty 20

## 2014-03-07 MED ORDER — LIDOCAINE HCL (CARDIAC) 20 MG/ML IV SOLN
INTRAVENOUS | Status: AC
  Filled 2014-03-07: qty 5

## 2014-03-07 MED ORDER — CIPROFLOXACIN IN D5W 400 MG/200ML IV SOLN
INTRAVENOUS | Status: AC
  Administered 2014-03-07: 400 mg via INTRAVENOUS
  Filled 2014-03-07: qty 200

## 2014-03-07 MED ORDER — NEOSTIGMINE METHYLSULFATE 1 MG/ML IJ SOLN
INTRAMUSCULAR | Status: AC
  Filled 2014-03-07: qty 10

## 2014-03-07 MED ORDER — NEOSTIGMINE METHYLSULFATE 1 MG/ML IJ SOLN
INTRAMUSCULAR | Status: DC | PRN
  Administered 2014-03-07: 2 mg via INTRAVENOUS

## 2014-03-07 MED ORDER — FENTANYL CITRATE 0.05 MG/ML IJ SOLN
INTRAMUSCULAR | Status: DC | PRN
  Administered 2014-03-07: 150 ug via INTRAVENOUS
  Administered 2014-03-07: 100 ug via INTRAVENOUS

## 2014-03-07 MED ORDER — PROPOFOL 10 MG/ML IV BOLUS
INTRAVENOUS | Status: DC | PRN
  Administered 2014-03-07: 50 mg via INTRAVENOUS
  Administered 2014-03-07: 150 mg via INTRAVENOUS

## 2014-03-07 MED ORDER — MIDAZOLAM HCL 5 MG/5ML IJ SOLN
INTRAMUSCULAR | Status: DC | PRN
  Administered 2014-03-07: 2 mg via INTRAVENOUS

## 2014-03-07 MED ORDER — ROCURONIUM BROMIDE 100 MG/10ML IV SOLN
INTRAVENOUS | Status: DC | PRN
  Administered 2014-03-07: 50 mg via INTRAVENOUS

## 2014-03-07 MED ORDER — ONDANSETRON HCL 4 MG/2ML IJ SOLN: 4.0000 mg | Freq: Once | INTRAMUSCULAR | Status: DC | PRN

## 2014-03-07 MED ORDER — VECURONIUM BROMIDE 10 MG IV SOLR
INTRAVENOUS | Status: DC | PRN
Start: 2014-03-07 — End: 2014-03-07
  Administered 2014-03-07: 3 mg via INTRAVENOUS

## 2014-03-07 MED ORDER — OXYCODONE HCL 5 MG PO TABS: 5.0000 mg | ORAL_TABLET | Freq: Once | ORAL | Status: DC | PRN

## 2014-03-07 MED ORDER — 0.9 % SODIUM CHLORIDE (POUR BTL) OPTIME
TOPICAL | Status: DC | PRN
  Administered 2014-03-07: 1000 mL

## 2014-03-07 MED ORDER — GLYCOPYRROLATE 0.2 MG/ML IJ SOLN
INTRAMUSCULAR | Status: AC
  Filled 2014-03-07: qty 2

## 2014-03-07 MED ORDER — MIDAZOLAM HCL 2 MG/2ML IJ SOLN
INTRAMUSCULAR | Status: AC
  Filled 2014-03-07: qty 2

## 2014-03-07 MED ORDER — GLYCOPYRROLATE 0.2 MG/ML IJ SOLN
INTRAMUSCULAR | Status: DC | PRN
  Administered 2014-03-07: 0.4 mg via INTRAVENOUS

## 2014-03-07 MED ORDER — OXYCODONE HCL 5 MG PO TABS
5.0000 mg | ORAL_TABLET | ORAL | Status: DC | PRN
  Administered 2014-03-07 – 2014-03-08 (×4): 10 mg via ORAL
  Filled 2014-03-07 (×4): qty 2

## 2014-03-07 MED ORDER — HYDROMORPHONE HCL PF 1 MG/ML IJ SOLN
0.2500 mg | INTRAMUSCULAR | Status: DC | PRN
  Administered 2014-03-07 (×4): 0.5 mg via INTRAVENOUS

## 2014-03-07 MED ORDER — OXYCODONE HCL 5 MG/5ML PO SOLN: 5.0000 mg | Freq: Once | ORAL | Status: DC | PRN

## 2014-03-07 SURGICAL SUPPLY — 43 items
APPLIER CLIP 5 13 M/L LIGAMAX5 (MISCELLANEOUS) ×4
APPLIER CLIP ROT 10 11.4 M/L (STAPLE)
BLADE SURG ROTATE 9660 (MISCELLANEOUS) IMPLANT
CANISTER SUCTION 2500CC (MISCELLANEOUS) ×4 IMPLANT
CHLORAPREP W/TINT 26ML (MISCELLANEOUS) ×4 IMPLANT
CLIP APPLIE 5 13 M/L LIGAMAX5 (MISCELLANEOUS) ×2 IMPLANT
CLIP APPLIE ROT 10 11.4 M/L (STAPLE) IMPLANT
COVER MAYO STAND STRL (DRAPES) ×4 IMPLANT
COVER SURGICAL LIGHT HANDLE (MISCELLANEOUS) ×4 IMPLANT
DECANTER SPIKE VIAL GLASS SM (MISCELLANEOUS) IMPLANT
DERMABOND ADVANCED (GAUZE/BANDAGES/DRESSINGS) ×2
DERMABOND ADVANCED .7 DNX12 (GAUZE/BANDAGES/DRESSINGS) ×2 IMPLANT
DRAPE C-ARM 42X72 X-RAY (DRAPES) IMPLANT
DRAPE UTILITY 15X26 W/TAPE STR (DRAPE) ×8 IMPLANT
ELECT REM PT RETURN 9FT ADLT (ELECTROSURGICAL) ×4
ELECTRODE REM PT RTRN 9FT ADLT (ELECTROSURGICAL) ×2 IMPLANT
FILTER SMOKE EVAC LAPAROSHD (FILTER) ×4 IMPLANT
GLOVE BIO SURGEON STRL SZ8 (GLOVE) ×4 IMPLANT
GLOVE BIOGEL PI IND STRL 8 (GLOVE) ×2 IMPLANT
GLOVE BIOGEL PI INDICATOR 8 (GLOVE) ×2
GOWN STRL REUS W/ TWL LRG LVL3 (GOWN DISPOSABLE) ×4 IMPLANT
GOWN STRL REUS W/ TWL XL LVL3 (GOWN DISPOSABLE) ×2 IMPLANT
GOWN STRL REUS W/TWL LRG LVL3 (GOWN DISPOSABLE) ×4
GOWN STRL REUS W/TWL XL LVL3 (GOWN DISPOSABLE) ×2
KIT BASIN OR (CUSTOM PROCEDURE TRAY) ×4 IMPLANT
KIT ROOM TURNOVER OR (KITS) ×4 IMPLANT
NEEDLE 22X1 1/2 (OR ONLY) (NEEDLE) IMPLANT
NS IRRIG 1000ML POUR BTL (IV SOLUTION) ×4 IMPLANT
PAD ARMBOARD 7.5X6 YLW CONV (MISCELLANEOUS) ×4 IMPLANT
POUCH RETRIEVAL ECOSAC 10 (ENDOMECHANICALS) ×2 IMPLANT
POUCH RETRIEVAL ECOSAC 10MM (ENDOMECHANICALS) ×2
SCISSORS LAP 5X35 DISP (ENDOMECHANICALS) ×4 IMPLANT
SET CHOLANGIOGRAPH 5 50 .035 (SET/KITS/TRAYS/PACK) IMPLANT
SET IRRIG TUBING LAPAROSCOPIC (IRRIGATION / IRRIGATOR) ×4 IMPLANT
SLEEVE ENDOPATH XCEL 5M (ENDOMECHANICALS) ×8 IMPLANT
SPECIMEN JAR SMALL (MISCELLANEOUS) ×4 IMPLANT
SUT VIC AB 4-0 PS2 27 (SUTURE) ×4 IMPLANT
TOWEL OR 17X24 6PK STRL BLUE (TOWEL DISPOSABLE) ×4 IMPLANT
TOWEL OR 17X26 10 PK STRL BLUE (TOWEL DISPOSABLE) ×4 IMPLANT
TRAY LAPAROSCOPIC (CUSTOM PROCEDURE TRAY) ×4 IMPLANT
TROCAR XCEL BLUNT TIP 100MML (ENDOMECHANICALS) ×4 IMPLANT
TROCAR XCEL NON-BLD 5MMX100MML (ENDOMECHANICALS) ×4 IMPLANT
WATER STERILE IRR 1000ML POUR (IV SOLUTION) IMPLANT

## 2014-03-07 NOTE — Progress Notes (Signed)
Utilization review completed.  

## 2014-03-07 NOTE — Progress Notes (Signed)
Day of Surgery  Subjective: In pre-op, less pain  Objective: Vital signs in last 24 hours: Temp:  [97.3 F (36.3 C)-97.9 F (36.6 C)] 97.3 F (36.3 C) (04/05 0544) Pulse Rate:  [72-81] 81 (04/05 0544) Resp:  [18] 18 (04/05 0544) BP: (101-126)/(59-81) 126/73 mmHg (04/05 0544) SpO2:  [98 %-99 %] 99 % (04/05 0544) Last BM Date: 03/03/14  Intake/Output from previous day: 04/04 0701 - 04/05 0700 In: -  Out: 1 [Urine:1] Intake/Output this shift:    General appearance: alert and cooperative Resp: clear to auscultation bilaterally Cardio: regular rate and rhythm GI: soft, minimal TTP RUQ  Lab Results:   Recent Labs  03/06/14 0600 03/07/14 0530  WBC 5.2 4.0  HGB 12.9 14.7  HCT 39.0 44.1  PLT 213 225   BMET  Recent Labs  03/06/14 0600 03/07/14 0530  NA 140 140  K 4.0 4.2  CL 103 99  CO2 21 23  GLUCOSE 109* 108*  BUN 5* 6  CREATININE 0.59 0.70  CALCIUM 9.3 9.8   PT/INR No results found for this basename: LABPROT, INR,  in the last 72 hours ABG No results found for this basename: PHART, PCO2, PO2, HCO3,  in the last 72 hours  Studies/Results: No results found.  Anti-infectives: Anti-infectives   Start     Dose/Rate Route Frequency Ordered Stop   03/07/14 0656  ciprofloxacin (CIPRO) 400 MG/200ML IVPB    Comments:  Kirt BoysMueller, Thomas   : cabinet override      03/07/14 [id number] 03/07/14 1859   03/06/14 1100  ciprofloxacin (CIPRO) IVPB 400 mg     400 mg 200 mL/hr over 60 Minutes Intravenous Every 12 hours 03/06/14 0954     03/06/14 1100  metroNIDAZOLE (FLAGYL) IVPB 500 mg     500 mg 100 mL/hr over 60 Minutes Intravenous Every 8 hours 03/06/14 0954        Assessment/Plan: Gallstones and likely choledocholithiasis - to OR for ERCP by Dr. [name] followed by laparoscopic cholecystectomy by myself. Procedure, risks, benefits D/W her and she agrees.  LOS: 1 day    Dilana Mcphie E 03/07/2014

## 2014-03-07 NOTE — Anesthesia Postprocedure Evaluation (Signed)
  Anesthesia Post-op Note  Patient: Stacy Dixon  Procedure(s) Performed: Procedure(s) with comments: ENDOSCOPIC RETROGRADE CHOLANGIOPANCREATOGRAPHY (ERCP) (N/A) - we are planning to do this at same time as cholecystectomy.  LAPAROSCOPIC CHOLECYSTECTOMY  (N/A)  Patient Location: PACU  Anesthesia Type:General  Level of Consciousness: awake, alert  and oriented  Airway and Oxygen Therapy: Patient Spontanous Breathing and Patient connected to nasal cannula oxygen  Post-op Pain: mild  Post-op Assessment: Post-op Vital signs reviewed, Patient's Cardiovascular Status Stable, Respiratory Function Stable, Patent Airway and Pain level controlled  Post-op Vital Signs: stable  Complications: No apparent anesthesia complications

## 2014-03-07 NOTE — Interval H&P Note (Signed)
History and Physical Interval Note:  03/07/2014 6:45 AM  Stacy Dixon  has presented today for surgery, with the diagnosis of choledocholithiasis  The various methods of treatment have been discussed with the patient and family. After consideration of risks, benefits and other options for treatment, the patient has consented to  Procedure(s) with comments: ENDOSCOPIC RETROGRADE CHOLANGIOPANCREATOGRAPHY (ERCP) (N/A) - we are planning to do this at same time as cholecystectomy.  LAPAROSCOPIC CHOLECYSTECTOMY WITH INTRAOPERATIVE CHOLANGIOGRAM (N/A) as a surgical intervention .  The patient's history has been reviewed, patient examined, no change in status, stable for surgery.  I have reviewed the patient's chart and labs.  Questions were answered to the patient's satisfaction.     Rachael FeeJacobs, Daniel P

## 2014-03-07 NOTE — Op Note (Signed)
03/06/2014 - 03/07/2014  9:49 AM  PATIENT:  Stacy Dixon  53 y.o. female  PRE-OPERATIVE DIAGNOSIS:  choledocholithiasis/ common bile duct stones  POST-OPERATIVE DIAGNOSIS:  common bile duct stones/ choledocholithiasis, cholecystitis   PROCEDURE:  Procedure(s): LAPAROSCOPIC CHOLECYSTECTOMY  SURGEON:  Violeta GelinasBurke Viyaan Champine, M.D.  ASSISTANTS: none   ANESTHESIA:   local and general  EBL:  Total I/O In: 1800 [I.V.:1800] Out: 10 [Blood:10]  BLOOD ADMINISTERED:none  DRAINS: none   SPECIMEN:  Excision  DISPOSITION OF SPECIMEN:  PATHOLOGY  COUNTS:  YES  DICTATION: Reubin Milan.Dragon Dictation  Patient underwent ERCP in the operating room. I am following with laparoscopic cholecystectomy. Informed consent was obtained earlier. She received intravenous antibiotics. Her abdomen was prepped and draped in sterile fashion. Time out procedure was done. Supraumbilical region was infiltrated with local. Supraumbilical incision was made. Subcutaneous tissues were dissected down revealing the anterior fascia. This was divided sharply along the midline. Peritoneal cavity was entered under direct vision without difficulty. 0 Vicryl pursestring was placed on the fascial opening. Hassan trocar was inserted. Abdomen was insufflated with carbon dioxide in standard fashion. Under direct vision, a 5 mm epigastric and 5 mm right port x2 were placed. Local was used at each port site. The gallbladder looked acutely inflamed. There were filmy adhesions from omentum which were swept down off the body and infundibulum. Infundibulum was then retracted inferolaterally. Dome was retracted superior medially. Dissection began laterally and progressed medially. There is a large anterior branch of the cystic artery. This was clipped twice proximally once distally and divided. Further dissection revealed the cystic duct and a small posterior branch of the cystic artery. Posterior rib cystic artery was clipped twice proximally once distally  and divided. Further dissection identified the cystic duct clearly with a good window between the cystic duct, the infundibulum, and the liver. Once excellent visualization, 3 clips were placed proximally on the cystic duct and one was placed distally and it was divided. Gallbladder was taken off the liver bed with Bovie cautery achieving good hemostasis along away. Gall bladder was placed in an Ecosac And removed from the supraumbilical port site.It was sent to pathology. Liver bed was copiously irrigated. Hemostasis was ensured. Clips remain in good position. Irrigation returned clear. Pneumoperitoneum was released. Ports removed under direct vision. Supraumbilical fascial pursestring was tied and all 4 wounds were copiously irrigated. Skin of each was closed with running 4 Vicryl subcuticular followed by Dermabond. All counts were correct. Patient tolerated procedure well without apparent complication was taken recovery in stable condition.  PATIENT DISPOSITION:  PACU - hemodynamically stable.   Delay start of Pharmacological VTE agent (>24hrs) due to surgical blood loss or risk of bleeding:  no  Violeta GelinasBurke Jorell Agne, MD, MPH, FACS Pager: [phone]  4/5/20159:49 AM

## 2014-03-07 NOTE — Anesthesia Procedure Notes (Signed)
Procedure Name: Intubation Date/Time: 03/07/2014 7:53 AM Performed by: Alanda AmassFRIEDMAN, Shauntel Prest A Pre-anesthesia Checklist: Patient identified, Timeout performed, Emergency Drugs available, Suction available and Patient being monitored Patient Re-evaluated:Patient Re-evaluated prior to inductionOxygen Delivery Method: Circle system utilized Preoxygenation: Pre-oxygenation with 100% oxygen Intubation Type: IV induction Ventilation: Mask ventilation without difficulty Laryngoscope Size: Mac and 3 Grade View: Grade II Tube type: Oral Tube size: 7.5 mm Number of attempts: 1 Airway Equipment and Method: Stylet Placement Confirmation: ETT inserted through vocal cords under direct vision,  breath sounds checked- equal and bilateral and positive ETCO2 Secured at: 22 cm Tube secured with: Tape Dental Injury: Teeth and Oropharynx as per pre-operative assessment

## 2014-03-07 NOTE — H&P (View-Only) (Signed)
Consultation  Referring Provider: [facility]     Primary Care Physician:  Dixon, Stacy Marcus, DO Primary Gastroenterologist: none        Reason for Consultation:  choledocholithiasis          HPI:   Stacy Dixon is a 53 y.o. female transferred from [facility] today with choledocholithiasis. Patient developed RUQ pain radiating through to back. No associated nausea but + chills. She has never had this pain before. Patient went to ED. LFTs elevated in mixed pattern. CBC normal, afebrile. I don't have records but according to H+P her RUQ ultrasound demonstrated CBD dilation. No ERCP coverage in [place] so she was transferred here.   Past Medical History  Diagnosis Date  . Asthma   . Hypertension   . Heart murmur     Some question of a history of A.Fib, but on no control meds and patient is in NSR on ED EKG  . GERD (gastroesophageal reflux disease)   . Anemia     Past Surgical History  Procedure Laterality Date  . Uterine fibroid surgery  2013  . Tubal ligation  1995    FMH: no liver diseases. No GI malignancies. Mother had COPD. Father died of MI.  History  Substance Use Topics  . Smoking status: Never Smoker   . Smokeless tobacco: Not on file  . Alcohol Use: Yes     Comment: socially    Prior to Admission medications   Medication Sig Start Date End Date Taking? Authorizing Provider  albuterol (PROVENTIL HFA;VENTOLIN HFA) 108 (90 BASE) MCG/ACT inhaler Inhale 2 puffs into the lungs every 6 (six) hours as needed for wheezing or shortness of breath.   Yes Historical Provider, MD  beclomethasone (QVAR) 80 MCG/ACT inhaler Inhale 2 puffs into the lungs daily as needed (for wheezing and shortness of breath).   Yes Historical Provider, MD  cetirizine (ZYRTEC) 10 MG tablet Take 10 mg by mouth daily.   Yes Historical Provider, MD  IRON PO Take 1 tablet by mouth daily.   Yes Historical Provider, MD  lisinopril-hydrochlorothiazide (PRINZIDE,ZESTORETIC) 20-25 MG per tablet Take  1 tablet by mouth daily.   Yes Historical Provider, MD  montelukast (SINGULAIR) 10 MG tablet Take 10 mg by mouth at bedtime.   Yes Historical Provider, MD  ranitidine (ZANTAC) 300 MG tablet Take 300 mg by mouth at bedtime.   Yes Historical Provider, MD  simvastatin (ZOCOR) 40 MG tablet Take 40 mg by mouth every evening.   Yes Historical Provider, MD  traZODone (DESYREL) 50 MG tablet Take 50 mg by mouth at bedtime as needed for sleep.   Yes Historical Provider, MD    Current Facility-Administered Medications  Medication Dose Route Frequency Provider Last Rate Last Dose  . 0.9 %  sodium chloride infusion   Intravenous Continuous Stacy Bow, DO 75 mL/hr at 03/06/14 1610    . ciprofloxacin (CIPRO) IVPB 400 mg  400 mg Intravenous Q12H Stacy Tat, MD      . ferrous sulfate tablet 325 mg  325 mg Oral Q breakfast Stacy Bow, DO   325 mg at 03/06/14 0847  . fluticasone (FLOVENT HFA) 44 MCG/ACT inhaler 1 puff  1 puff Inhalation BID Stacy Bow, DO   1 puff at 03/06/14 0740  . heparin injection 5,000 Units  5,000 Units Subcutaneous 3 times per day Stacy Bow, DO      . hydrochlorothiazide (MICROZIDE) capsule 12.5 mg  12.5 mg Oral Daily Stacy Dixon  Stacy Sniff, DO      . lisinopril (PRINIVIL,ZESTRIL) tablet 10 mg  10 mg Oral Daily Stacy Bow, DO      . loratadine (CLARITIN) tablet 10 mg  10 mg Oral Daily Stacy Bow, DO      . metroNIDAZOLE (FLAGYL) IVPB 500 mg  500 mg Intravenous Q8H Stacy Tat, MD      . montelukast (SINGULAIR) tablet 10 mg  10 mg Oral QHS Stacy Bow, DO      . morphine 2 MG/ML injection 2-4 mg  2-4 mg Intravenous Q4H PRN Stacy Bow, DO      . ondansetron [facility]) injection 4 mg  4 mg Intravenous Q6H PRN Stacy Lipa, NP      . traZODone (DESYREL) tablet 50 mg  50 mg Oral QHS Stacy Bow, DO        Allergies as of 03/05/2014  . (Not on File)   Review of Systems:    All systems reviewed and negative except where noted in HPI.   Physical  Exam:  Vital signs in last 24 hours: Temp:  [98 F (36.7 C)-98.5 F (36.9 C)] 98 F (36.7 C) (04/04 0556) Pulse Rate:  [83-91] 83 (04/04 0556) Resp:  [18] 18 (04/04 0556) BP: (130-140)/(78-82) 130/78 mmHg (04/04 0556) SpO2:  [97 %-98 %] 98 % (04/04 0556) Weight:  [200 lb 6.4 oz (90.9 kg)] 200 lb 6.4 oz (90.9 kg) (04/04 0200) Last BM Date: 03/03/14 General:   Pleasant black female in NAD Head:  Normocephalic and atraumatic. Eyes:   No icterus.   Conjunctiva pink. Ears:  Normal auditory acuity. Neck:  Supple; no masses felt Lungs:  Respirations even and unlabored. Lungs clear to auscultation bilaterally.   No wheezes, crackles, or rhonchi.  Heart:  Regular rate and rhythm Abdomen:  Soft, nondistended, mild RUQ tenderness. Normal bowel sounds. No appreciable masses or hepatomegaly.  Rectal:  Not performed.  Msk:  Symmetrical without gross deformities.  Extremities:  Without edema. Neurologic:  Alert and  oriented x4;  grossly normal neurologically. Skin:  Intact without significant lesions or rashes. Cervical Nodes:  No significant cervical adenopathy. Psych:  Alert and cooperative. Normal affect.  LAB RESULTS:  Recent Labs  03/06/14 0600  WBC 5.2  HGB 12.9  HCT 39.0  PLT 213   BMET  Recent Labs  03/06/14 0600  NA 140  K 4.0  CL 103  CO2 21  GLUCOSE 109*  BUN 5*  CREATININE 0.59  CALCIUM 9.3   LFT  Recent Labs  03/06/14 0600  PROT 6.8  ALBUMIN 3.2*  AST 529*  ALT 808*  ALKPHOS 208*  BILITOT 4.0*     Impression / Plan:   53 year old female with RUQ pain, chills, elevated LFTs and ultrasound done at [facility] revealing cholelithiasis, CBD dilation. Patient will need ERCP with probable stone extraction followed by cholecystectomy. The risks and benefits of ERCP with stone extraction / possible sphincterotomy were explained to the patient and she agrees to proceed. ERCP will be done in OR in am at time of cholecystectomy (we are arranging with surgery). Continue  IV Cipro / flagyl. NPO after midnight. Will d/c SQ heparin this evening.   Thanks   LOS: 0 days   Stacy Dixon  03/06/2014, 11:36 AM    ________________________________________________________________________  Stacy Dixon [facility] MD note:  I personally examined the patient, reviewed the data and agree with the assessment and plan described above.  Planning on ERCP in OR  tomorrow followed by lap chole. I spoke with OR staff, currently will be 7:30 AM case start but emergency case may bush it back if needed. OR will call me on cell [phone] if 7:30 is not going to happen.   Rob Buntinganiel Luman Holway, MD [facility] Pager [phone]

## 2014-03-07 NOTE — Op Note (Signed)
[facility] [address] [place], [id number]   ERCP PROCEDURE REPORT  PATIENT: Stacy Dixon, Stacy D.  MR# :[id number] BIRTHDATE: May 05, 1961  GENDER: Female ENDOSCOPIST: Rachael Feeaniel P Ocia Simek, MD REFERRED BY: [facility] (transfered from [place]) PROCEDURE DATE:  03/07/2014 PROCEDURE:   ERCP with sphincterotomy/papillotomy and ERCP with removal of calculus/calculi ASA CLASS:   Class II INDICATIONS:On [facility] US: dilated bile duct to 11mm; elevated liver tests, abd pain, + stones in GB. MEDICATIONS: General endotracheal anesthesia (GETA) TOPICAL ANESTHETIC: none  DESCRIPTION OF PROCEDURE:   After the risks benefits and alternatives of the procedure were thoroughly explained, informed consent was obtained.  The Pentax Ercp Scope [id number]  endoscope was introduced through the mouth  and advanced to the second portion of the duodenum without detailed examination of the GI tract. The major papilla was normal.  A 44 Autotome over a .035 hydrawire was used to cannulate the major papilla and contrast was injected. Cholangiogram revealed non-dilated biliary tree, the cystic duct was patent, there was at least one small (1-152mm) floating filling defects in CBD. An adequate biliary sphincterotomy was performed and then the bile duct was swept with retrieval balloon. A few small black flecks (stone fragments) were delivered into the duodenum. There was no purulence.  An occlusion, completion cholangiogram showed no residual filling defects. The scope was then completely withdrawn from the patient and the procedure terminated.  The main pancreatic duct was cannulated with wire but was never injected with dye.     COMPLICATIONS: No immediate complications  ENDOSCOPIC IMPRESSION: Small stone fragments in bile duct.  Treated with biliary sphincterotomy and balloon sweeping.  RECOMMENDATIONS: Proceed with planned lap chole immediately following this  procedure. Drs. Hung/ Loreta AveMann will be assuming her GI care in the morning (I was covering for them this weekend).  _______________________________ eSignedRachael Fee:  Ernst Cumpston P Liddie Chichester, MD 03/07/2014 8:56 AM   CC: Charna ElizabethJyothi Mann, MD

## 2014-03-07 NOTE — Transfer of Care (Signed)
Immediate Anesthesia Transfer of Care Note  Patient: Stacy Dixon  Procedure(s) Performed: Procedure(s) with comments: ENDOSCOPIC RETROGRADE CHOLANGIOPANCREATOGRAPHY (ERCP) (N/A) - we are planning to do this at same time as cholecystectomy.  LAPAROSCOPIC CHOLECYSTECTOMY  (N/A)  Patient Location: PACU  Anesthesia Type:General  Level of Consciousness: awake  Airway & Oxygen Therapy: Patient Spontanous Breathing  Post-op Assessment: Report given to PACU RN and Post -op Vital signs reviewed and stable  Post vital signs: Reviewed and stable  Complications: No apparent anesthesia complications

## 2014-03-07 NOTE — Progress Notes (Signed)
PROGRESS NOTE  Stacy Dixon QMV:[id number] DOB: 1961/04/22 DOA: 03/06/2014 PCP: Stacy RaringWHITE, NZINGHA J, DO  Assessment/Plan: Choledocholithiasis/Cholecystitis -03/05/2014 abdominal ultrasound @[facility]  showed extrahepatic ductal dilatation with common bile duct 11 mm  -s/p lap chole--Dr. [name] 03/07/2014 -s/p ERCP with sphincterotomy and balloon sweep 03/07/2014--Dr. [name] -Continue empiric antibiotics--will likely be able to discontinue soon -Continue IV fluids  -Continue clear liquid diet today Transaminasemia  -Due to cholecystitis and choledocholithiasis -Trending down -check hep b surface antigen, hep c antibody--negative Hypertension  -Continue lisinopril and HCTZ  -Controlled  Asthma  -Stable  -Continue Flovent  -Continue Singulair  Antibiotics:  cipro 03/06/14>>>  Flagyl 03/06/14>>>  Family Communication:   sister at beside Disposition Plan:   Home when medically stable      Procedures/Studies: Dg Ercp Biliary & Pancreatic Ducts  03/07/2014   CLINICAL DATA:  Common bile duct stones  EXAM: ERCP  TECHNIQUE: Multiple spot images obtained with the fluoroscopic device and submitted for interpretation post-procedure.  COMPARISON:  None.  FINDINGS: Two spot films were obtained during an ERCP. A guidewire is noted extending into the biliary tree. Injection the common bile duct shows no definitive filling defects although evaluation is incomplete. Correlation with the operative findings is recommended.  : These images were submitted for radiologic interpretation only. Please see the procedural report for the amount of contrast and the fluoroscopy time utilized.   Electronically Signed   By: [name]  Lukens M.D.   On: 03/07/2014 10:42         Subjective: Patient is drowsy after surgery. She denies any chest pain, shortness breath, nausea, vomiting patient has abdominal pain. No diarrhea. No fevers or chills.  Objective: Filed Vitals:   03/07/14 1045 03/07/14 1100  03/07/14 1115 03/07/14 1224  BP: 135/65 116/47 127/60 115/53  Pulse: 83 80 85   Temp: 98.4 F (36.9 C)     TempSrc:      Resp: 17 13 12    Height:      Weight:      SpO2: 98% 95% 93%     Intake/Output Summary (Last 24 hours) at 03/07/14 1253 Last data filed at 03/07/14 0941  Gross per 24 hour  Intake   1800 ml  Output     10 ml  Net   1790 ml   Weight change:  Exam:   General:  Pt is alert, follows commands appropriately, not in acute distress  HEENT: No icterus, No thrush, No neck mass, [place]/AT  Cardiovascular: RRR, S1/S2, no rubs, no gallops  Respiratory: CTA bilaterally, no wheezing, no crackles, no rhonchi  Abdomen: Soft/+BS, diffuse tenderness without any peritoneal signs or rebound., non distended, no guarding  Extremities: No edema, No lymphangitis, No petechiae, No rashes, no synovitis  Data Reviewed: Basic Metabolic Panel:  Recent Labs Lab 03/06/14 0600 03/07/14 0530  NA 140 140  K 4.0 4.2  CL 103 99  CO2 21 23  GLUCOSE 109* 108*  BUN 5* 6  CREATININE 0.59 0.70  CALCIUM 9.3 9.8   Liver Function Tests:  Recent Labs Lab 03/06/14 0600 03/07/14 0530  AST 529* 247*  ALT 808* 669*  ALKPHOS 208* 229*  BILITOT 4.0* 1.4*  PROT 6.8 7.8  ALBUMIN 3.2* 3.6    Recent Labs Lab 03/06/14 0600 03/07/14 0530  LIPASE 63* 140*   No results found for this basename: AMMONIA,  in the last 168 hours CBC:  Recent Labs Lab 03/06/14 0600 03/07/14 0530  WBC 5.2 4.0  HGB 12.9 14.7  HCT 39.0 44.1  MCV 85.0 84.6  PLT 213 225   Cardiac Enzymes: No results found for this basename: CKTOTAL, CKMB, CKMBINDEX, TROPONINI,  in the last 168 hours BNP: No components found with this basename: POCBNP,  CBG: No results found for this basename: GLUCAP,  in the last 168 hours  Recent Results (from the past 240 hour(s))  SURGICAL PCR SCREEN     Status: None   Collection Time    03/07/14  3:41 AM      Result Value Ref Range Status   MRSA, PCR NEGATIVE  NEGATIVE  Final   Staphylococcus aureus NEGATIVE  NEGATIVE Final   Comment:            The Xpert SA Assay (FDA     approved for NASAL specimens     in patients over 64 years of age),     is one component of     a comprehensive surveillance     program.  Test performance has     been validated by [employer] for patients greater     than or equal to 58 year old.     It is not intended     to diagnose infection nor to     guide or monitor treatment.     Scheduled Meds: . ciprofloxacin  400 mg Intravenous Q12H  . famotidine (PEPCID) IV  20 mg Intravenous Q12H  . ferrous sulfate  325 mg Oral Q breakfast  . fluticasone  1 puff Inhalation BID  . glucagon      . hydrochlorothiazide  12.5 mg Oral Daily  . lisinopril  10 mg Oral Daily  . loratadine  10 mg Oral Daily  . metronidazole  500 mg Intravenous Q8H  . montelukast  10 mg Oral QHS  . traZODone  50 mg Oral QHS   Continuous Infusions: . sodium chloride 75 mL/hr at 03/06/14 0313     Pearce Littlefield, DO  [facility] Pager [phone]  If 7PM-7AM, please contact night-coverage [email or web address] Password [email or web address] 03/07/2014, 12:53 PM   LOS: 1 day

## 2014-03-07 NOTE — Anesthesia Preprocedure Evaluation (Addendum)
Anesthesia Evaluation  Patient identified by MRN, date of birth, ID band Patient awake    Reviewed: Allergy & Precautions, H&P , NPO status , Patient's Chart, lab work & pertinent test results  Airway Mallampati: II TM Distance: >3 FB     Dental  (+) Teeth Intact, Dental Advisory Given   Pulmonary asthma ,  Needs inhaler approx 2x/month  breath sounds clear to auscultation        Cardiovascular hypertension, Pt. on medications Rhythm:Regular Rate:Normal     Neuro/Psych    GI/Hepatic   Endo/Other    Renal/GU      Musculoskeletal   Abdominal (+) + obese,   Peds  Hematology   Anesthesia Other Findings   Reproductive/Obstetrics                          Anesthesia Physical Anesthesia Plan  ASA: II  Anesthesia Plan: General   Post-op Pain Management:    Induction: Intravenous  Airway Management Planned: Oral ETT  Additional Equipment:   Intra-op Plan:   Post-operative Plan: Extubation in OR  Informed Consent: I have reviewed the patients History and Physical, chart, labs and discussed the procedure including the risks, benefits and alternatives for the proposed anesthesia with the patient or authorized representative who has indicated his/her understanding and acceptance.   Dental advisory given  Plan Discussed with: CRNA and Anesthesiologist  Anesthesia Plan Comments: (Cholelithiasis with CBD stone Htn H/O asthma lungs clear   Plan GA with oral ETT  Kipp Broodavid Shamila Lerch, MD)        Anesthesia Quick Evaluation

## 2014-03-08 ENCOUNTER — Encounter (HOSPITAL_COMMUNITY): Payer: Self-pay | Admitting: Gastroenterology

## 2014-03-08 LAB — COMPREHENSIVE METABOLIC PANEL
ALK PHOS: 172 U/L — AB (ref 39–117)
ALT: 435 U/L — AB (ref 0–35)
AST: 131 U/L — ABNORMAL HIGH (ref 0–37)
Albumin: 3.4 g/dL — ABNORMAL LOW (ref 3.5–5.2)
BUN: 5 mg/dL — ABNORMAL LOW (ref 6–23)
CALCIUM: 9.2 mg/dL (ref 8.4–10.5)
CO2: 26 mEq/L (ref 19–32)
Chloride: 96 mEq/L (ref 96–112)
Creatinine, Ser: 0.65 mg/dL (ref 0.50–1.10)
GFR calc non Af Amer: >90 mL/min (ref >90)
GLUCOSE: 127 mg/dL — AB (ref 70–99)
Potassium: 3.7 mEq/L (ref 3.7–5.3)
SODIUM: 136 meq/L — AB (ref 137–147)
Total Bilirubin: 0.9 mg/dL (ref 0.3–1.2)
Total Protein: 7.2 g/dL (ref 6.0–8.3)

## 2014-03-08 LAB — CBC
HEMATOCRIT: 41.4 % (ref 36.0–46.0)
HEMOGLOBIN: 13.8 g/dL (ref 12.0–15.0)
MCH: 28.3 pg (ref 26.0–34.0)
MCHC: 33.3 g/dL (ref 30.0–36.0)
MCV: 85 fL (ref 78.0–100.0)
Platelets: 222 10*3/uL (ref 150–400)
RBC: 4.87 MIL/uL (ref 3.87–5.11)
RDW: 14 % (ref 11.5–15.5)
WBC: 8.5 10*3/uL (ref 4.0–10.5)

## 2014-03-08 MED ORDER — OXYCODONE HCL 5 MG PO TABS: 5.0000 mg | ORAL_TABLET | ORAL | Status: DC | PRN

## 2014-03-08 MED ORDER — METRONIDAZOLE 500 MG PO TABS: 500.0000 mg | ORAL_TABLET | Freq: Three times a day (TID) | ORAL | Status: DC

## 2014-03-08 MED ORDER — METRONIDAZOLE 500 MG PO TABS
500.0000 mg | ORAL_TABLET | Freq: Three times a day (TID) | ORAL | Status: DC
  Administered 2014-03-08: 500 mg via ORAL
  Filled 2014-03-08 (×2): qty 1

## 2014-03-08 MED ORDER — CIPROFLOXACIN HCL 500 MG PO TABS: 500.0000 mg | ORAL_TABLET | Freq: Two times a day (BID) | ORAL | Status: DC

## 2014-03-08 MED ORDER — CIPROFLOXACIN HCL 500 MG PO TABS
500.0000 mg | ORAL_TABLET | Freq: Two times a day (BID) | ORAL | Status: DC
  Administered 2014-03-08: 500 mg via ORAL
  Filled 2014-03-08 (×3): qty 1

## 2014-03-08 NOTE — Progress Notes (Signed)
1 Day Post-Op  Subjective: +flatus, no dysuria.  Pain controlled, just sore.  Tolerated clear liquids.  Ambulating.    Objective: Vital signs in last 24 hours: Temp:  [97.6 F (36.4 C)-98.5 F (36.9 C)] 98.3 F (36.8 C) (04/06 0607) Pulse Rate:  [80-90] 83 (04/06 0607) Resp:  [12-19] 18 (04/06 0607) BP: (102-135)/(46-74) 109/73 mmHg (04/06 0607) SpO2:  [93 %-100 %] 94 % (04/06 0607) Last BM Date: 03/04/14  Intake/Output from previous day: 04/05 0701 - 04/06 0700 In: 2350 [I.V.:1800; IV Piggyback:550] Out: 10 [Blood:10] Intake/Output this shift:   PE General appearance: alert, cooperative and no distress GI: +bs, abdomen is soft, appropriately tender.  incisions are c/d/i  Lab Results:   Recent Labs  03/06/14 0600 03/07/14 0530  WBC 5.2 4.0  HGB 12.9 14.7  HCT 39.0 44.1  PLT 213 225   BMET  Recent Labs  03/06/14 0600 03/07/14 0530  NA 140 140  K 4.0 4.2  CL 103 99  CO2 21 23  GLUCOSE 109* 108*  BUN 5* 6  CREATININE 0.59 0.70  CALCIUM 9.3 9.8   PT/INR No results found for this basename: LABPROT, INR,  in the last 72 hours ABG No results found for this basename: PHART, PCO2, PO2, HCO3,  in the last 72 hours  Studies/Results: Dg Ercp Biliary & Pancreatic Ducts  03/07/2014   CLINICAL DATA:  Common bile duct stones  EXAM: ERCP  TECHNIQUE: Multiple spot images obtained with the fluoroscopic device and submitted for interpretation post-procedure.  COMPARISON:  None.  FINDINGS: Two spot films were obtained during an ERCP. A guidewire is noted extending into the biliary tree. Injection the common bile duct shows no definitive filling defects although evaluation is incomplete. Correlation with the operative findings is recommended.  : These images were submitted for radiologic interpretation only. Please see the procedural report for the amount of contrast and the fluoroscopy time utilized.   Electronically Signed   By: [name]  Lukens M.D.   On: 03/07/2014 10:42     Anti-infectives: Anti-infectives   Start     Dose/Rate Route Frequency Ordered Stop   03/06/14 1100  ciprofloxacin (CIPRO) IVPB 400 mg     400 mg 200 mL/hr over 60 Minutes Intravenous Every 12 hours 03/06/14 0954     03/06/14 1100  metroNIDAZOLE (FLAGYL) IVPB 500 mg     500 mg 100 mL/hr over 60 Minutes Intravenous Every 8 hours 03/06/14 0954        Assessment/Plan: S/p ERCP S/p Laparoscopic cholecystectomy  POD#1 -advance diet -mobilize -pain control with oral pain meds -stable for discharge from surgical standpoint.  Instructions provided, work statement provided, follow up arranged   LOS: 2 days     Bonner PunaRIEBOCK, [facility] HospitalEMINA ANP-BC Pager [phone] 03/08/2014 7:56 AM

## 2014-03-08 NOTE — Progress Notes (Signed)
Chaplain spoke to pt from the door.  Pt was trying to get out of bed; chaplain sought the assistance of pt's nurse.   03/08/14 1600  Clinical Encounter Type  Visited With Patient  Visit Type Spiritual support  Referral From Nurse   Rulon Abideavid B Sherrod, chaplain pager [phone]

## 2014-03-08 NOTE — Progress Notes (Signed)
Agree with above 

## 2014-03-08 NOTE — Discharge Summary (Signed)
Physician Discharge Summary  Stacy Dixon HWT:[id number] DOB: 03/14/1961 DOA: 03/06/2014  PCP: Creola Corn, DO  Admit date: 03/06/2014 Discharge date: 03/08/2014  Recommendations for Outpatient Follow-up:  1. Pt will need to follow up with PCP in 2 weeks post discharge 2. Please obtain BMP to evaluate electrolytes and kidney function 3. Please also check CBC to evaluate Hg and Hct levels   Discharge Diagnoses:  Principal Problem:   Choledocholithiasis with obstruction Active Problems:   Transaminitis   Cholelithiasis   HTN (hypertension)   Unspecified asthma, with exacerbation   Choledocholithiasis   Cholecystitis, acute Choledocholithiasis/Cholecystitis  -03/05/2014 abdominal ultrasound @[facility]  showed extrahepatic ductal dilatation with common bile duct 11 mm  -s/p lap chole--Dr. Grandville Silos 03/07/2014  -s/p ERCP with sphincterotomy and balloon sweep 03/07/2014--Dr. Ardis Hughs  -Continue empiric antibiotics--plan 5 more days to complete 7 days of abx -Saline lock IV fluids -Advance diet which the patient tolerated -Followup with Gen. surgery in the office 03/30/2014 Transaminasemia  -Due to cholecystitis and choledocholithiasis  -Trending down  -check hep b surface antigen, hep c antibody--negative  Hypertension  -Continue lisinopril and HCTZ  -Controlled  Asthma  -Stable  -Continue Flovent  -Continue Singulair  Antibiotics:  cipro 03/06/14>>>  Flagyl 03/06/14>>>   Discharge Condition: Stable  Disposition:  home Follow-up Information   Follow up with [facility] [place] [facility] 03/30/2014. (arrive by 3:15PM for a 3:45PM appt)    Contact information:   [address]   [place] [address] [phone]       Diet: Heart healthy Wt Readings from Last 3 Encounters:  03/06/14 90.9 kg (200 lb 6.4 oz)  03/06/14 90.9 kg (200 lb 6.4 oz)  03/06/14 90.9 kg (200 lb 6.4 oz)    History of present illness:  53 year old female with a two-day history of right  upper quadrant abdominal pain associated with nausea and vomiting. Abdominal pain worsened with eating. The patient presented to [facility].  Work up in [facility] demonstrates transaminitis with AST of 1105, ALT 823, Tbili 4.1, Alk phos 251. Lipase was WNL. RUQ ultrasound demonstrates extrahepatic biliary duct dilatation. CBD dilation of up to 56m. Mid to distal common bile duct are obscured by gas. Suspect mass or calculus in mid to distal CBD, and they recommend ERCP.  [facility]. With coordination, the patient underwent ERCP and laparoscopic cholecystectomy on 03/07/2014. A sphincterotomy and balloon sweep were performed. The patient had no immediate postoperative complications. Her diet was advanced. She tolerated. The patient was sent home in stable condition. She will follow up with Gen. surgery 03/30/2014   Consultants: General surgery   gastroenterology-[place]  Discharge Exam: Filed Vitals:   03/08/14 0607  BP: 109/73  Pulse: 83  Temp: 98.3 F (36.8 C)  Resp: 18   Filed Vitals:   03/07/14 2049 03/07/14 2206 03/08/14 0607 03/08/14 0908  BP: 126/63 112/73 109/73   Pulse:  83 83   Temp:  98.5 F (36.9 C) 98.3 F (36.8 C)   TempSrc:  Oral Oral   Resp: 19 18 18    Height:      Weight:      SpO2:  98% 94% 96%   General: A&O x 3, NAD, pleasant, cooperative Cardiovascular: RRR, no rub, no gallop, no S3 Respiratory: CTAB, no wheeze, no rhonchi Abdomen:soft, nontender, nondistended, positive bowel sounds Extremities: No edema, No lymphangitis, no petechiae  Discharge Instructions  Future Appointments Provider Department Dept Phone   03/30/2014 3:45 PM [facility], [place] [phone]       Medication List         albuterol 108 (90 BASE) MCG/ACT inhaler  Commonly known as:  PROVENTIL HFA;VENTOLIN HFA  Inhale 2 puffs into the lungs every 6 (six) hours  as needed for wheezing or shortness of breath.     beclomethasone 80 MCG/ACT inhaler  Commonly known as:  QVAR  Inhale 2 puffs into the lungs daily as needed (for wheezing and shortness of breath).     cetirizine 10 MG tablet  Commonly known as:  ZYRTEC  Take 10 mg by mouth daily.     ciprofloxacin 500 MG tablet  Commonly known as:  CIPRO  Take 1 tablet (500 mg total) by mouth 2 (two) times daily.     IRON PO  Take 1 tablet by mouth daily.     lisinopril-hydrochlorothiazide 20-25 MG per tablet  Commonly known as:  PRINZIDE,ZESTORETIC  Take 1 tablet by mouth daily.     metroNIDAZOLE 500 MG tablet  Commonly known as:  FLAGYL  Take 1 tablet (500 mg total) by mouth every 8 (eight) hours.     montelukast 10 MG tablet  Commonly known as:  SINGULAIR  Take 10 mg by mouth at bedtime.     oxyCODONE 5 MG immediate release tablet  Commonly known as:  Oxy IR/ROXICODONE  Take 1-2 tablets (5-10 mg total) by mouth every 4 (four) hours as needed (pain).     ranitidine 300 MG tablet  Commonly known as:  ZANTAC  Take 300 mg by mouth at bedtime.     simvastatin 40 MG tablet  Commonly known as:  ZOCOR  Take 40 mg by mouth every evening.     traZODone 50 MG tablet  Commonly known as:  DESYREL  Take 50 mg by mouth at bedtime as needed for sleep.         The results of significant diagnostics from this hospitalization (including imaging, microbiology, ancillary and laboratory) are listed below for reference.    Significant Diagnostic Studies: Dg Ercp Biliary & Pancreatic Ducts  03/07/2014   CLINICAL DATA:  Common bile duct stones  EXAM: ERCP  TECHNIQUE: Multiple spot images obtained with the fluoroscopic device and submitted for interpretation post-procedure.  COMPARISON:  None.  FINDINGS: Two spot films were obtained during an ERCP. A guidewire is noted extending into the biliary tree. Injection the common bile duct shows no definitive filling defects although evaluation is incomplete.  Correlation with the operative findings is recommended.  : These images were submitted for radiologic interpretation only. Please see the procedural report for the amount of contrast and the fluoroscopy time utilized.   Electronically Signed   By: Inez Catalina M.D.   On: 03/07/2014 10:42     Microbiology: Recent Results (from the past 240 hour(s))  SURGICAL PCR SCREEN     Status: None   Collection Time    03/07/14  3:41 AM      Result Value Ref Range Status   MRSA, PCR NEGATIVE  NEGATIVE Final   Staphylococcus aureus NEGATIVE  NEGATIVE Final   Comment:            The Xpert SA Assay (FDA     approved for NASAL specimens     in patients over 25 years of age),     is one component of  a comprehensive surveillance     program.  Test performance has     been validated by [facility] for patients greater     than or equal to 97 year old.     It is not intended     to diagnose infection nor to     guide or monitor treatment.     Labs: Basic Metabolic Panel:  Recent Labs Lab 03/06/14 0600 03/07/14 0530 03/08/14 0725  NA 140 140 136*  K 4.0 4.2 3.7  CL 103 99 96  CO2 21 23 26   GLUCOSE 109* 108* 127*  BUN 5* 6 5*  CREATININE 0.59 0.70 0.65  CALCIUM 9.3 9.8 9.2   Liver Function Tests:  Recent Labs Lab 03/06/14 0600 03/07/14 0530 03/08/14 0725  AST 529* 247* 131*  ALT 808* 669* 435*  ALKPHOS 208* 229* 172*  BILITOT 4.0* 1.4* 0.9  PROT 6.8 7.8 7.2  ALBUMIN 3.2* 3.6 3.4*    Recent Labs Lab 03/06/14 0600 03/07/14 0530  LIPASE 63* 140*   No results found for this basename: AMMONIA,  in the last 168 hours CBC:  Recent Labs Lab 03/06/14 0600 03/07/14 0530 03/08/14 0725  WBC 5.2 4.0 8.5  HGB 12.9 14.7 13.8  HCT 39.0 44.1 41.4  MCV 85.0 84.6 85.0  PLT 213 225 222   Cardiac Enzymes: No results found for this basename: CKTOTAL, CKMB, CKMBINDEX, TROPONINI,  in the last 168 hours BNP: No components found with this basename: POCBNP,  CBG: No results  found for this basename: GLUCAP,  in the last 168 hours  Time coordinating discharge:  Greater than 30 minutes  Signed:  Shamicka Inga, DO [facility] Pager: [phone] 03/08/2014, 1:24 PM

## 2014-03-08 NOTE — Discharge Instructions (Signed)

## 2014-03-08 NOTE — Progress Notes (Signed)
Patient given discharge teaching and paperwork regarding medications, diet, follow-up appointments and activity. Patient understanding verbalized. IV sites discontinued. Skin assessment as previously charted and vitals are stable. Patient being discharged to home. Daughter present during discharge teaching. Adella NissenBailey, Labrittany Wechter G, RN

## 2014-03-09 MED ORDER — OXYCODONE HCL 5 MG PO TABS: 5.0000 mg | ORAL_TABLET | ORAL | Status: DC | PRN

## 2014-03-15 ENCOUNTER — Telehealth (INDEPENDENT_AMBULATORY_CARE_PROVIDER_SITE_OTHER): Payer: Self-pay

## 2014-03-15 NOTE — Telephone Encounter (Signed)
Patient states she did not get pain RX upon discharge, Rates pain 7-8 . Norco 5/325mg   Will be presented  to urg MD

## 2014-03-15 NOTE — Telephone Encounter (Signed)
Advised patient needs to call DR. Tat for oxycodone  she states she did not get upon discharge. Patient verbalized understanding

## 2014-03-30 ENCOUNTER — Encounter (INDEPENDENT_AMBULATORY_CARE_PROVIDER_SITE_OTHER): Payer: Self-pay

## 2014-04-13 ENCOUNTER — Encounter (INDEPENDENT_AMBULATORY_CARE_PROVIDER_SITE_OTHER): Payer: Self-pay | Admitting: General Surgery

## 2014-04-13 ENCOUNTER — Ambulatory Visit (INDEPENDENT_AMBULATORY_CARE_PROVIDER_SITE_OTHER): Payer: Medicaid Other | Admitting: General Surgery

## 2014-04-13 VITALS — BP 146/96 | HR 90 | Temp 97.1°F | Ht 61.0 in | Wt 195.0 lb

## 2014-04-13 DIAGNOSIS — K8000 Calculus of gallbladder with acute cholecystitis without obstruction: Secondary | ICD-10-CM

## 2014-04-13 DIAGNOSIS — K8012 Calculus of gallbladder with acute and chronic cholecystitis without obstruction: Secondary | ICD-10-CM

## 2014-04-13 DIAGNOSIS — K8010 Calculus of gallbladder with chronic cholecystitis without obstruction: Secondary | ICD-10-CM

## 2014-04-13 NOTE — Patient Instructions (Signed)
Follow up as needed

## 2014-04-13 NOTE — Progress Notes (Signed)
Stacy Dixon [birth year] [id number] 04/13/2014   Stacy Dixon is a 53 y.o. female who had a laparoscopic cholecystectomy by Dr. Violeta GelinasBurke Thompson.  The pathology report confirmed chronic cholecystitis and cholelithiasis.  The patient reports that they are feeling well with normal bowel movements and good appetite.  The pre-operative symptoms of abdominal pain, nausea, and vomiting have resolved.    Physical examination - Incisions appear well-healed with no sign of infection or bleeding.   Abdomen - soft, non-tender  Impression:  s/p laparoscopic cholecystectomy  Plan:  She may resume a regular diet and full activity.  She may follow-up on a PRN basis.

## 2016-08-01 ENCOUNTER — Ambulatory Visit
Admission: RE | Admit: 2016-08-01 | Discharge: 2016-08-01 | Disposition: A | Discharge status: Home / Self Care | Payer: Self-pay | Source: Ambulatory Visit | Attending: Oncology | Admitting: Oncology

## 2016-08-01 ENCOUNTER — Encounter (INDEPENDENT_AMBULATORY_CARE_PROVIDER_SITE_OTHER): Payer: Self-pay

## 2016-08-01 ENCOUNTER — Ambulatory Visit: Payer: Self-pay | Attending: Oncology

## 2016-08-01 VITALS — BP 164/95 | HR 94 | Temp 98.1°F | Ht 63.78 in | Wt 214.8 lb

## 2016-08-01 DIAGNOSIS — Z0000 Encounter for general adult medical examination without abnormal findings: Secondary | ICD-10-CM

## 2016-08-01 NOTE — Progress Notes (Signed)
Subjective:     Patient ID: Stacy Dixon, female   DOB: 1961-10-29, 55 y.o.   MRN: [id number]  HPI   Review of Systems     Objective:   Physical Exam  Pulmonary/Chest: Right breast exhibits no inverted nipple, no mass, no nipple discharge, no skin change and no tenderness. Left breast exhibits no inverted nipple, no mass, no nipple discharge, no skin change and no tenderness. Breasts are symmetrical.  Large pendulous breasts  Genitourinary: No labial fusion. There is no rash, tenderness, lesion or injury on the right labia. There is no rash, tenderness, lesion or injury on the left labia. Uterus is not deviated, not enlarged, not fixed and not tender. Cervix exhibits no motion tenderness, no discharge and no friability. Right adnexum displays no mass, no tenderness and no fullness. Left adnexum displays no mass, no tenderness and no fullness. No erythema, tenderness or bleeding in the vagina. No foreign body in the vagina. No signs of injury around the vagina. No vaginal discharge found.       Assessment:     55 [facility].  Patient screened, and meets B[facility]CP eligibility.  Patient does not have insurance, Medicare or Medicaid.  Handout given on Affordable Care Act. Instructed patient on breast self-exam using teach back method.  CBE unremarkable.  Pelvic exam normal.  Patient has history of uterine repair in 2014.  Patient has 55 year old daughter, and 4318 year son.    Plan:     Sent for bilateral screening mammogram.  Specimen collected for pap.

## 2016-08-22 LAB — PAP LB AND HPV HIGH-RISK
HPV, HIGH-RISK: NEGATIVE
PAP Smear Comment: 0

## 2016-10-29 NOTE — Progress Notes (Signed)
Phoned patient with Normal mammogram, and negative/negative pap.  To return in one year for annual screening.  Copy to [facility].

## 2020-03-04 ENCOUNTER — Emergency Department
Admission: EM | Admit: 2020-03-04 | Discharge: 2020-03-04 | Disposition: A | Discharge status: Home / Self Care | Payer: Self-pay | Attending: Emergency Medicine | Admitting: Emergency Medicine

## 2020-03-04 ENCOUNTER — Other Ambulatory Visit: Payer: Self-pay

## 2020-03-04 ENCOUNTER — Emergency Department: Payer: Self-pay

## 2020-03-04 DIAGNOSIS — R42 Dizziness and giddiness: Secondary | ICD-10-CM | POA: Insufficient documentation

## 2020-03-04 DIAGNOSIS — I10 Essential (primary) hypertension: Secondary | ICD-10-CM | POA: Insufficient documentation

## 2020-03-04 DIAGNOSIS — R002 Palpitations: Secondary | ICD-10-CM | POA: Insufficient documentation

## 2020-03-04 DIAGNOSIS — Z20822 Contact with and (suspected) exposure to covid-19: Secondary | ICD-10-CM | POA: Insufficient documentation

## 2020-03-04 LAB — COMPREHENSIVE METABOLIC PANEL
ALT: 54 U/L — ABNORMAL HIGH (ref 0–44)
AST: 61 U/L — ABNORMAL HIGH (ref 15–41)
Albumin: 3.6 g/dL (ref 3.5–5.0)
Alkaline Phosphatase: 125 U/L (ref 38–126)
Anion gap: 10 (ref 5–15)
BUN: 11 mg/dL (ref 6–20)
CO2: 25 mmol/L (ref 22–32)
Calcium: 9.1 mg/dL (ref 8.9–10.3)
Chloride: 103 mmol/L (ref 98–111)
Creatinine, Ser: 0.57 mg/dL (ref 0.44–1.00)
GFR calc Af Amer: >60 mL/min (ref >60)
GFR calc non Af Amer: >60 mL/min (ref >60)
Glucose, Bld: 267 mg/dL — ABNORMAL HIGH (ref 70–99)
Potassium: 4 mmol/L (ref 3.5–5.1)
Sodium: 138 mmol/L (ref 135–145)
Total Bilirubin: 0.5 mg/dL (ref 0.3–1.2)
Total Protein: 7.3 g/dL (ref 6.5–8.1)

## 2020-03-04 LAB — T4, FREE: Free T4: 1.01 ng/dL (ref 0.61–1.12)

## 2020-03-04 LAB — CBC
HCT: 42.9 % (ref 36.0–46.0)
Hemoglobin: 13.8 g/dL (ref 12.0–15.0)
MCH: 26.6 pg (ref 26.0–34.0)
MCHC: 32.2 g/dL (ref 30.0–36.0)
MCV: 82.7 fL (ref 80.0–100.0)
Platelets: 272 10*3/uL (ref 150–400)
RBC: 5.19 MIL/uL — ABNORMAL HIGH (ref 3.87–5.11)
RDW: 13 % (ref 11.5–15.5)
WBC: 7.8 10*3/uL (ref 4.0–10.5)
nRBC: 0 % (ref 0.0–0.2)

## 2020-03-04 LAB — RESPIRATORY PANEL BY RT PCR (FLU A&B, COVID)
Influenza A by PCR: NEGATIVE
Influenza B by PCR: NEGATIVE
SARS Coronavirus 2 by RT PCR: NEGATIVE

## 2020-03-04 LAB — TROPONIN I (HIGH SENSITIVITY)
Troponin I (High Sensitivity): 4 ng/L (ref <18)
Troponin I (High Sensitivity): 3 ng/L (ref <18)

## 2020-03-04 LAB — TSH: TSH: 0.798 u[IU]/mL (ref 0.350–4.500)

## 2020-03-04 MED ORDER — ONDANSETRON 4 MG PO TBDP: 4.0000 mg | ORAL_TABLET | Freq: Three times a day (TID) | ORAL | 0 refills | Status: DC | PRN

## 2020-03-04 MED ORDER — LORAZEPAM 1 MG PO TABS: 1.0000 mg | ORAL_TABLET | Freq: Two times a day (BID) | ORAL | 0 refills | Status: AC | PRN

## 2020-03-04 MED ORDER — ONDANSETRON HCL 4 MG/2ML IJ SOLN
4.0000 mg | Freq: Once | INTRAMUSCULAR | Status: AC
  Administered 2020-03-04: 4 mg via INTRAVENOUS
  Filled 2020-03-04: qty 2

## 2020-03-04 MED ORDER — ACETAMINOPHEN 500 MG PO TABS
1000.0000 mg | ORAL_TABLET | Freq: Once | ORAL | Status: AC
  Administered 2020-03-04: 1000 mg via ORAL
  Filled 2020-03-04: qty 2

## 2020-03-04 MED ORDER — LORAZEPAM 2 MG/ML IJ SOLN: 0.5000 mg | Freq: Once | INTRAMUSCULAR | Status: DC

## 2020-03-04 NOTE — ED Provider Notes (Signed)
[facility] Emergency Department Provider Note       Time seen: ----------------------------------------- 4:49 PM on 03/04/2020 -----------------------------------------   I have reviewed the triage vital signs and the nursing notes.  HISTORY   Chief Complaint No chief complaint on file.   HPI Stacy Dixon is a 59 y.o. female with a history of anemia, asthma, GERD who presents to the ED for chest pressure, hypertension and palpitations.  Patient states this event happened about a week ago as well.  Recently she was diagnosed with diabetes and has been started on oral diabetes medicines.  She states today her heart was racing, she had chest pressure and checked her blood pressure which was elevated.  She called EMS due to the high blood pressure which was 932 systolic  Past Medical History:  Diagnosis Date  . Anemia   . Asthma   . GERD (gastroesophageal reflux disease)   . Heart murmur    Some question of a history of A.Fib, but on no control meds and patient is in NSR on ED EKG  . Hypertension     Patient Active Problem List   Diagnosis Date Noted  . Choledocholithiasis 03/07/2014  . Cholecystitis, acute 03/07/2014  . Choledocholithiasis with obstruction 03/06/2014  . Transaminitis 03/06/2014  . Cholelithiasis 03/06/2014  . HTN (hypertension) 03/06/2014  . Unspecified asthma, with exacerbation 03/06/2014    Past Surgical History:  Procedure Laterality Date  . CHOLECYSTECTOMY N/A 03/07/2014   Procedure: LAPAROSCOPIC CHOLECYSTECTOMY ;  Surgeon: Zenovia Jarred, MD;  [facility];  Service: General;  Laterality: N/A;  . ERCP N/A 03/07/2014   Procedure: ENDOSCOPIC RETROGRADE CHOLANGIOPANCREATOGRAPHY (ERCP);  Surgeon: Milus Banister, MD;  Location: [place];  Service: Endoscopy;  Laterality: N/A;  we are planning to do this at same time as cholecystectomy.   . TUBAL LIGATION  1995  . UTERINE FIBROID SURGERY  2013     Allergies Prednisone  Social History Social History   Tobacco Use  . Smoking status: Never Smoker  Substance Use Topics  . Alcohol use: Yes    Comment: socially  . Drug use: No    Review of Systems Constitutional: Negative for fever. Cardiovascular: Positive for chest pain, palpitations Respiratory: Negative for shortness of breath. Gastrointestinal: Negative for abdominal pain, vomiting and diarrhea. Musculoskeletal: Negative for back pain. Skin: Negative for rash. Neurological: Negative for headaches, focal weakness or numbness.  All systems negative/normal/unremarkable except as stated in the HPI  ____________________________________________   PHYSICAL EXAM:  VITAL SIGNS: ED Triage Vitals  Enc Vitals Group     BP      Pulse      Resp      Temp      Temp src      SpO2      Weight      Height      Head Circumference      Peak Flow      Pain Score      Pain Loc      Pain Edu?      Excl. in [place]?     Constitutional: Alert and oriented. Well appearing and in no distress. Eyes: Conjunctivae are normal. Normal extraocular movements. Cardiovascular: Normal rate, regular rhythm. No murmurs, rubs, or gallops. Respiratory: Normal respiratory effort without tachypnea nor retractions. Breath sounds are clear and equal bilaterally. No wheezes/rales/rhonchi. Gastrointestinal: Soft and nontender. Normal bowel sounds Musculoskeletal: Nontender with normal range of motion in extremities. No lower extremity tenderness nor  edema. Neurologic:  Normal speech and language. No gross focal neurologic deficits are appreciated.  Skin:  Skin is warm, dry and intact. No rash noted. Psychiatric: Mood and affect are normal. Speech and behavior are normal.  ____________________________________________  EKG: Interpreted by me.  Sinus rhythm rate of 98 bpm, normal PR interval, normal QRS, normal QT  ____________________________________________  ED COURSE:  As part of my medical  decision making, I reviewed the following data within the electronic MEDICAL RECORD NUMBER History obtained from family if available, nursing notes, old chart and ekg, as well as notes from prior ED visits. Patient presented for chest pain and palpitations with hypertension, we will assess with labs and imaging as indicated at this time.   Procedures  Stacy Dixon was evaluated in Emergency Department on 03/04/2020 for the symptoms described in the history of present illness. She was evaluated in the context of the global COVID-19 pandemic, which necessitated consideration that the patient might be at risk for infection with the SARS-CoV-2 virus that causes COVID-19. Institutional protocols and algorithms that pertain to the evaluation of patients at risk for COVID-19 are in a state of rapid change based on information released by regulatory bodies including the CDC and federal and state organizations. These policies and algorithms were followed during the patient's care in the ED.  ____________________________________________   LABS (pertinent positives/negatives)  Labs Reviewed  CBC - Abnormal; Notable for the following components:      Result Value   RBC 5.19 (*)    All other components within normal limits  COMPREHENSIVE METABOLIC PANEL - Abnormal; Notable for the following components:   Glucose, Bld 267 (*)    AST 61 (*)    ALT 54 (*)    All other components within normal limits  RESPIRATORY PANEL BY RT PCR (FLU A&B, COVID)  TSH  T4, FREE  TROPONIN I (HIGH SENSITIVITY)  TROPONIN I (HIGH SENSITIVITY)    RADIOLOGY Images were viewed by me  Chest x-ray  IMPRESSION: Unremarkable AP view of the chest. ____________________________________________   DIFFERENTIAL DIAGNOSIS   Anxiety, arrhythmia, MI, dehydration, electrolyte abnormality, hypoglycemia  FINAL ASSESSMENT AND PLAN  Palpitations, dizziness   Plan: The patient had presented for chest pain and palpitations with  hypertension. Patient's labs were unremarkable including repeat troponin testing. Patient's imaging did not reveal any acute process.  Her symptoms have completely resolved without any specific treatment.  She will be discharged home with prescription for Ativan to take as needed.  She is already scheduled for outpatient cardiology follow-up.   Ulice Dash, MD    Note: This note was generated in part or whole with voice recognition software. Voice recognition is usually quite accurate but there are transcription errors that can and very often do occur. I apologize for any typographical errors that were not detected and corrected.     Emily Filbert, MD 03/04/20 [phone]

## 2020-03-04 NOTE — ED Notes (Signed)
Attempted to draw blood x2 w/out success. Primary RN notified.

## 2020-03-04 NOTE — ED Notes (Signed)
Patient is alert, calm. Daughter at bedside.

## 2020-03-04 NOTE — ED Triage Notes (Signed)
Pt c/o chest pressure and left arm numbness that started an hour ago. Pt also c/o mild nausea and dizziness and states she has a headache from nitro. EMS admin 1 nitro and 324 ASA. Per EMS pt is new DM x1 week, BGL 231. Pt is AOx4, skin warm, dry, pink. NAD noted. Pt reports 6/10 chest pressure at this time.

## 2020-03-26 ENCOUNTER — Other Ambulatory Visit: Payer: Self-pay

## 2020-03-26 ENCOUNTER — Emergency Department
Admission: EM | Admit: 2020-03-26 | Discharge: 2020-03-26 | Disposition: A | Discharge status: Home / Self Care | Payer: Self-pay | Attending: Emergency Medicine | Admitting: Emergency Medicine

## 2020-03-26 ENCOUNTER — Encounter: Payer: Self-pay | Admitting: Emergency Medicine

## 2020-03-26 ENCOUNTER — Emergency Department: Payer: Self-pay

## 2020-03-26 DIAGNOSIS — E876 Hypokalemia: Secondary | ICD-10-CM | POA: Insufficient documentation

## 2020-03-26 DIAGNOSIS — E860 Dehydration: Secondary | ICD-10-CM | POA: Insufficient documentation

## 2020-03-26 DIAGNOSIS — Z79899 Other long term (current) drug therapy: Secondary | ICD-10-CM | POA: Insufficient documentation

## 2020-03-26 DIAGNOSIS — R739 Hyperglycemia, unspecified: Secondary | ICD-10-CM

## 2020-03-26 DIAGNOSIS — R002 Palpitations: Secondary | ICD-10-CM | POA: Insufficient documentation

## 2020-03-26 DIAGNOSIS — E1165 Type 2 diabetes mellitus with hyperglycemia: Secondary | ICD-10-CM | POA: Insufficient documentation

## 2020-03-26 DIAGNOSIS — I10 Essential (primary) hypertension: Secondary | ICD-10-CM | POA: Insufficient documentation

## 2020-03-26 HISTORY — DX: Type 2 diabetes mellitus without complications: E11.9

## 2020-03-26 LAB — CBC
HCT: 45.2 % (ref 36.0–46.0)
Hemoglobin: 14.7 g/dL (ref 12.0–15.0)
MCH: 26.9 pg (ref 26.0–34.0)
MCHC: 32.5 g/dL (ref 30.0–36.0)
MCV: 82.6 fL (ref 80.0–100.0)
Platelets: 263 10*3/uL (ref 150–400)
RBC: 5.47 MIL/uL — ABNORMAL HIGH (ref 3.87–5.11)
RDW: 12.3 % (ref 11.5–15.5)
WBC: 9 10*3/uL (ref 4.0–10.5)
nRBC: 0 % (ref 0.0–0.2)

## 2020-03-26 LAB — URINALYSIS, COMPLETE (UACMP) WITH MICROSCOPIC
Bacteria, UA: NONE SEEN
Bilirubin Urine: NEGATIVE
Glucose, UA: >=500 mg/dL — AB
Hgb urine dipstick: NEGATIVE
Ketones, ur: 20 mg/dL — AB
Leukocytes,Ua: NEGATIVE
Nitrite: NEGATIVE
Protein, ur: NEGATIVE mg/dL
Specific Gravity, Urine: 1.009 (ref 1.005–1.030)
pH: 5 (ref 5.0–8.0)

## 2020-03-26 LAB — BASIC METABOLIC PANEL
Anion gap: 9 (ref 5–15)
BUN: 10 mg/dL (ref 6–20)
CO2: 27 mmol/L (ref 22–32)
Calcium: 9.2 mg/dL (ref 8.9–10.3)
Chloride: 100 mmol/L (ref 98–111)
Creatinine, Ser: 0.51 mg/dL (ref 0.44–1.00)
GFR calc Af Amer: >60 mL/min (ref >60)
GFR calc non Af Amer: >60 mL/min (ref >60)
Glucose, Bld: 306 mg/dL — ABNORMAL HIGH (ref 70–99)
Potassium: 3.2 mmol/L — ABNORMAL LOW (ref 3.5–5.1)
Sodium: 136 mmol/L (ref 135–145)

## 2020-03-26 LAB — TSH: TSH: 1.98 u[IU]/mL (ref 0.350–4.500)

## 2020-03-26 LAB — TROPONIN I (HIGH SENSITIVITY)
Troponin I (High Sensitivity): 4 ng/L (ref <18)
Troponin I (High Sensitivity): 3 ng/L (ref <18)

## 2020-03-26 LAB — T4, FREE: Free T4: 1.14 ng/dL — ABNORMAL HIGH (ref 0.61–1.12)

## 2020-03-26 MED ORDER — POTASSIUM CHLORIDE CRYS ER 20 MEQ PO TBCR
40.0000 meq | EXTENDED_RELEASE_TABLET | Freq: Once | ORAL | Status: AC
  Administered 2020-03-26: 40 meq via ORAL
  Filled 2020-03-26: qty 2

## 2020-03-26 MED ORDER — SODIUM CHLORIDE 0.9 % IV BOLUS
1000.0000 mL | Freq: Once | INTRAVENOUS | Status: AC
  Administered 2020-03-26: 05:00:00 1000 mL via INTRAVENOUS

## 2020-03-26 NOTE — ED Provider Notes (Signed)
[facility] Emergency Department Provider Note   ____________________________________________   First MD Initiated Contact with Patient 03/26/20 [phone]     (approximate)  I have reviewed the triage vital signs and the nursing notes.   HISTORY  Chief Complaint Tachycardia    HPI Stacy Dixon is a 59 y.o. female who presents to the ED from home with a chief complaint of palpitations.  Patient states she was awoken out of her sleep feeling like her heart was racing and having chills.  She has had similar symptoms for the past 3 weeks.  Patient was recently diagnosed with diabetes and started on oral hypoglycemics as well as insulin.  She has been feeling overwhelmed as well as increased stress at work.  She had a recent cardiology appointment on 4/7 and is scheduled for stress test.  Did not mention her palpitations to the cardiologist.  Denies fever, cough, chest pain, shortness of breath, abdominal pain, nausea, vomiting or dizziness.  Denies known COVID-19 exposure.  Drinks some tea but denies coffee, sodas or energy drinks.  Denies recent travel, trauma or hormone use.       Past Medical History:  Diagnosis Date  . Anemia   . Asthma   . Diabetes mellitus without complication (HCC)   . GERD (gastroesophageal reflux disease)   . Heart murmur    Some question of a history of A.Fib, but on no control meds and patient is in NSR on ED EKG  . Hypertension     Patient Active Problem List   Diagnosis Date Noted  . Choledocholithiasis 03/07/2014  . Cholecystitis, acute 03/07/2014  . Choledocholithiasis with obstruction 03/06/2014  . Transaminitis 03/06/2014  . Cholelithiasis 03/06/2014  . HTN (hypertension) 03/06/2014  . Unspecified asthma, with exacerbation 03/06/2014    Past Surgical History:  Procedure Laterality Date  . CHOLECYSTECTOMY N/A 03/07/2014   Procedure: LAPAROSCOPIC CHOLECYSTECTOMY ;  Surgeon: Liz Malady, MD;  Location: [facility] OR;   Service: General;  Laterality: N/A;  . ERCP N/A 03/07/2014   Procedure: ENDOSCOPIC RETROGRADE CHOLANGIOPANCREATOGRAPHY (ERCP);  Surgeon: Rachael Fee, MD;  Location: [facility] OR;  Service: Endoscopy;  Laterality: N/A;  we are planning to do this at same time as cholecystectomy.   . TUBAL LIGATION  1995  . UTERINE FIBROID SURGERY  2013    Prior to Admission medications   Medication Sig Start Date End Date Taking? Authorizing Provider  albuterol (PROVENTIL HFA;VENTOLIN HFA) 108 (90 BASE) MCG/ACT inhaler Inhale 2 puffs into the lungs every 6 (six) hours as needed for wheezing or shortness of breath.    [provider]  beclomethasone (QVAR) 80 MCG/ACT inhaler Inhale 2 puffs into the lungs daily as needed (for wheezing and shortness of breath).    [provider]  cetirizine (ZYRTEC) 10 MG tablet Take 10 mg by mouth daily.    [provider]  ciprofloxacin (CIPRO) 500 MG tablet Take 1 tablet (500 mg total) by mouth 2 (two) times daily. 03/08/14   Catarina Hartshorn, MD  IRON PO Take 1 tablet by mouth daily.    [provider]  lisinopril-hydrochlorothiazide (PRINZIDE,ZESTORETIC) 20-25 MG per tablet Take 1 tablet by mouth daily.    [provider]  LORazepam (ATIVAN) 1 MG tablet Take 1 tablet (1 mg total) by mouth 2 (two) times daily as needed for anxiety. 03/04/20 03/04/21  Emily Filbert, MD  metroNIDAZOLE (FLAGYL) 500 MG tablet Take 1 tablet (500 mg total) by mouth every 8 (eight) hours.  03/08/14   Catarina Hartshorn, MD  montelukast (SINGULAIR) 10 MG tablet Take 10 mg by mouth at bedtime.    [provider]  ondansetron (ZOFRAN ODT) 4 MG disintegrating tablet Take 1 tablet (4 mg total) by mouth every 8 (eight) hours as needed for nausea or vomiting. 03/04/20   Emily Filbert, MD  ranitidine (ZANTAC) 300 MG tablet Take 300 mg by mouth at bedtime.    [provider]  simvastatin (ZOCOR) 40 MG tablet Take 40 mg by mouth every evening.    [provider]  traZODone (DESYREL) 50 MG tablet Take 50 mg by mouth at bedtime as needed for sleep.    [provider]    Allergies Prednisone  Family History  Problem Relation Age of Onset  . Breast cancer Cousin 47    Social History Social History   Tobacco Use  . Smoking status: Never Smoker  . Smokeless tobacco: Never Used  Substance Use Topics  . Alcohol use: Yes    Comment: socially  . Drug use: No    Review of Systems  Constitutional: No fever/chills Eyes: No visual changes. ENT: No sore throat. Cardiovascular: Positive for palpitations.  Denies chest pain. Respiratory: Denies shortness of breath. Gastrointestinal: No abdominal pain.  No nausea, no vomiting.  No diarrhea.  No constipation. Genitourinary: Negative for dysuria. Musculoskeletal: Negative for back pain. Skin: Negative for rash. Neurological: Negative for headaches, focal weakness or numbness.   ____________________________________________   PHYSICAL EXAM:  VITAL SIGNS: ED Triage Vitals  Enc Vitals Group     BP 03/26/20 0336 (!) 165/78     Pulse Rate 03/26/20 0336 (!) 102     Resp 03/26/20 0336 18     Temp 03/26/20 0336 97.8 F (36.6 C)     Temp Source 03/26/20 0336 Oral     SpO2 03/26/20 0336 98 %     Weight 03/26/20 0337 177 lb (80.3 kg)     Height 03/26/20 0337 5\' 1"  (1.549 m)     Head Circumference --      Peak Flow --      Pain Score 03/26/20 0337 8     Pain Loc --      Pain Edu? --      Excl. in [place]? --     Constitutional: Alert and oriented. Well appearing and in no acute distress. Eyes: Conjunctivae are normal. PERRL. EOMI. Head: Atraumatic. Nose: No congestion/rhinnorhea. Mouth/Throat: Mucous membranes are moist.   Neck: No stridor.  No thyromegaly. Cardiovascular: Normal rate, regular rhythm. Grossly normal heart sounds.  Good peripheral circulation. Respiratory: Normal respiratory effort.  No retractions. Lungs CTAB. Gastrointestinal: Soft and nontender. No  distention. No abdominal bruits. No CVA tenderness. Musculoskeletal: No lower extremity tenderness nor edema.  No joint effusions. Neurologic:  Normal speech and language. No gross focal neurologic deficits are appreciated. No gait instability. Skin:  Skin is warm, dry and intact. No rash noted. Psychiatric: Mood and affect are normal. Speech and behavior are normal.  ____________________________________________   LABS (all labs ordered are listed, but only abnormal results are displayed)  Labs Reviewed  BASIC METABOLIC PANEL - Abnormal; Notable for the following components:      Result Value   Potassium 3.2 (*)    Glucose, Bld 306 (*)    All other components within normal limits  CBC - Abnormal; Notable for the following components:   RBC 5.47 (*)    All other components within normal limits  URINALYSIS, COMPLETE (  UACMP) WITH MICROSCOPIC - Abnormal; Notable for the following components:   Color, Urine STRAW (*)    APPearance CLEAR (*)    Glucose, UA >=500 (*)    Ketones, ur 20 (*)    All other components within normal limits  T4, FREE - Abnormal; Notable for the following components:   Free T4 1.14 (*)    All other components within normal limits  TSH  TROPONIN I (HIGH SENSITIVITY)  TROPONIN I (HIGH SENSITIVITY)   ____________________________________________  EKG  ED ECG REPORT I, Shakil Dirk J, the attending physician, personally viewed and interpreted this ECG.   Date: 03/26/2020  EKG Time: 0336  Rate: 103  Rhythm: sinus tachycardia  Axis: Normal  Intervals:none  ST&T Change: Nonspecific  ____________________________________________  RADIOLOGY  ED MD interpretation: No acute cardiopulmonary process  Official radiology report(s): DG Chest 2 View  Result Date: 03/26/2020 CLINICAL DATA:  59 year old female with history of chest pain. EXAM: CHEST - 2 VIEW COMPARISON:  Chest x-ray 03/04/2020. FINDINGS: Lung volumes are normal. No consolidative airspace disease. No  pleural effusions. No pneumothorax. No pulmonary nodule or mass noted. Pulmonary vasculature and the cardiomediastinal silhouette are within normal limits. IMPRESSION: No radiographic evidence of acute cardiopulmonary disease. Electronically Signed   By: Vinnie Langton M.D.   On: 03/26/2020 04:55    ____________________________________________   PROCEDURES  Procedure(s) performed (including Critical Care):  .1-3 Lead EKG Interpretation Performed by: Paulette Blanch, MD Authorized by: Paulette Blanch, MD     Interpretation: normal     ECG rate:  100   ECG rate assessment: normal     Rhythm: sinus rhythm     Ectopy: none     Conduction: normal   Comments:     Patient was placed on the cardiac monitor to monitor for arrhythmias     ____________________________________________   INITIAL IMPRESSION / ASSESSMENT AND PLAN / ED COURSE  As part of my medical decision making, I reviewed the following data within the [place] notes reviewed and incorporated, Labs reviewed, EKG interpreted, Old chart reviewed, Radiograph reviewed and Notes from prior ED visits     Stacy Dixon was evaluated in Emergency Department on 03/26/2020 for the symptoms described in the history of present illness. She was evaluated in the context of the global COVID-19 pandemic, which necessitated consideration that the patient might be at risk for infection with the SARS-CoV-2 virus that causes COVID-19. Institutional protocols and algorithms that pertain to the evaluation of patients at risk for COVID-19 are in a state of rapid change based on information released by regulatory bodies including the CDC and federal and state organizations. These policies and algorithms were followed during the patient's care in the ED.    59 year old female who presents with palpitations.  Differential diagnosis includes but is not limited to ACS, thyroid disorder, anxiety, infectious, metabolic etiologies,  etc.  I have personally reviewed patient's chart and reviewed her clinic visit with the cardiologist on 4/7.  She is scheduled for her stress test on 5/5.  Laboratory results thus far demonstrate mild hypokalemia and hyperglycemia without elevated anion gap.  Will repeat potassium orally, infuse IV fluids.  Check thyroid panel and urinalysis.   Clinical Course as of Mar 26 613  Sat Mar 26, 2020  0612 Patient resting in no acute distress.  Updated her on all test results.  She will follow up with cardiology.  Strict return precautions given.  Patient verbalizes understanding agrees with plan  of care.   [JS]    Clinical Course User Index [JS] Irean Hong, MD     ____________________________________________   FINAL CLINICAL IMPRESSION(S) / ED DIAGNOSES  Final diagnoses:  Palpitations  Hyperglycemia  Hypokalemia  Dehydration     ED Discharge Orders    None       Note:  This document was prepared using Dragon voice recognition software and may include unintentional dictation errors.   Irean Hong, MD 03/26/20 [phone]

## 2020-03-26 NOTE — ED Triage Notes (Addendum)
Patient states that she was woken out of her sleep this morning feeling like her heart was racing and having chills. Patient states that she has had this feeling of her heart racing times two weeks. Patient states that her temperature was 99.4 at home this morning. Patient also complains of feeling pressure in her head.

## 2020-03-26 NOTE — Discharge Instructions (Addendum)
Continue to take all of your medications as directed by your doctor.  Drink plenty of fluids daily.  Return to the ER for worsening symptoms, persistent vomiting, difficulty breathing or other concerns.

## 2020-06-06 NOTE — Progress Notes (Signed)
[facility] Program [facility]  [phone] [phone]  Patient Care Team: Gorden Harms, PA-C as PCP - General (Physician Assistant) Jim Like, RN as Registered Nurse Scarlett Presto, RN as Registered Nurse   Name of the patient: Denise Washburn  [id number]  1961/04/05   Date of visit: 06/07/2020  SUBJECTIVE Ms. Feltes is a [id number] female who presents today for a breast exam and mammogram. She denies any symptoms including breast pain, tenderness, redness or discharge.   Her last Mammogram was 07/2016.  Her last PAP smear was 07/2016. This PAP was negative. She is due for a pap August 2022.    [facility] Medical History Record - 06/07/20 1110      Breast History   CBE Date 06/07/20    Provider (CBE) [facility]    Last Mammogram Annual    Last Mammogram Date 08/02/16    Provider (Mammogram)  Delford Field    Recent Breast Symptoms None      Breast Cancer History   Breast Cancer History No personal or family history    Comments/Details mothers niece had breast cancer at the age of 84.      Previous History of Breast Problems   Breast Surgery or Biopsy None    Breast Implants N/A    BSE Done Monthly      Gynecological/Obstetrical History   LMP 04/24/20    Is there any chance that the client could be pregnant?  No    PAP smear history Annually    Date of last PAP  08/02/16    Provider (PAP) [facility]    Breast fed children No    DES Exposure No    Cervical, Uterine or Ovarian cancer No    Family history of Cervial, Uterine or Ovarian cancer No    Hysterectomy No    Cervix removed No    Ovaries removed No    Laser/Cryosurgery No    Current method of birth control None    Current method of Estrogen/Hormone replacement None    Smoking history None            OBJECTIVE BP (!) 146/85   Pulse 100   Temp 97.8 F (36.6 C)   Resp 20   Ht 5\' 2"  (1.575 m)   Wt 82.6 kg   LMP 06/01/2016 (Approximate)   SpO2 100%   BMI 33.31 kg/m     Physical Exam HENT:     Head: Normocephalic.     Mouth/Throat:     Mouth: Mucous membranes are moist.     Pharynx: Oropharynx is clear.  Pulmonary:     Effort: Pulmonary effort is normal.  Chest:     Chest wall: No mass or edema.     Breasts: Breasts are symmetrical.        Right: Normal. No swelling, bleeding, inverted nipple, mass, nipple discharge, skin change or tenderness.        Left: Normal. No swelling, bleeding, inverted nipple, mass, nipple discharge, skin change or tenderness.     Comments: Patient reports completing weekly SBE lying flat, and looking at self in a mirror.  Musculoskeletal:     Cervical back: Normal range of motion.  Skin:    General: Skin is warm and dry.  Neurological:     Mental Status: She is alert.  Psychiatric:        Mood and Affect: Mood normal.        Thought Content: Thought content normal.  Judgment: Judgment normal.     ASSESSMENT & PLAN:   Ms. Akter is a 59 year old female who presents to the [facility] clinic for continued health maintenance.   Today's breast exam normal, see Exam.   She was sent to the Breast Center for her 3D Mammogram.

## 2020-06-07 ENCOUNTER — Ambulatory Visit: Payer: Self-pay | Attending: Oncology | Admitting: *Deleted

## 2020-06-07 ENCOUNTER — Ambulatory Visit
Admission: RE | Admit: 2020-06-07 | Discharge: 2020-06-07 | Disposition: A | Discharge status: Home / Self Care | Payer: Self-pay | Source: Ambulatory Visit | Attending: Oncology | Admitting: Oncology

## 2020-06-07 ENCOUNTER — Encounter: Payer: Self-pay | Admitting: *Deleted

## 2020-06-07 ENCOUNTER — Other Ambulatory Visit: Payer: Self-pay

## 2020-06-07 VITALS — BP 146/85 | HR 100 | Temp 97.8°F | Resp 20 | Ht 62.0 in | Wt 182.1 lb

## 2020-06-07 DIAGNOSIS — Z0000 Encounter for general adult medical examination without abnormal findings: Secondary | ICD-10-CM

## 2020-06-08 ENCOUNTER — Encounter: Payer: Self-pay | Admitting: *Deleted

## 2020-06-08 NOTE — Progress Notes (Signed)
Letter mailed from the [facility] to inform patient of her normal mammogram results.  Patient is to follow-up with annual screening in one year. 

## 2021-03-11 IMAGING — MG DIGITAL SCREENING BILAT W/ TOMO W/ CAD
6 of 10 series · 6 of 30 positions shown · non-contrast
Comparison: Previous exam(s).

CLINICAL DATA: Screening.

EXAM:
DIGITAL SCREENING BILATERAL MAMMOGRAM WITH TOMO AND CAD

[R MLO synth-2D]
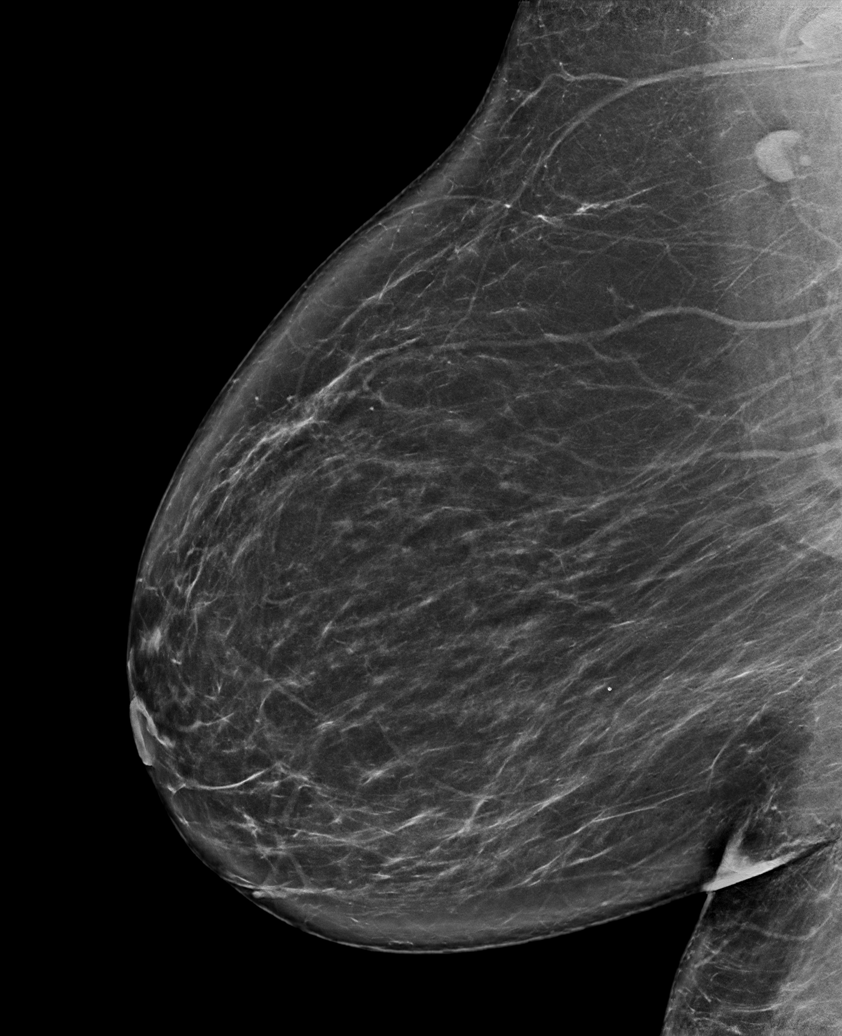

[L CC synth-2D]
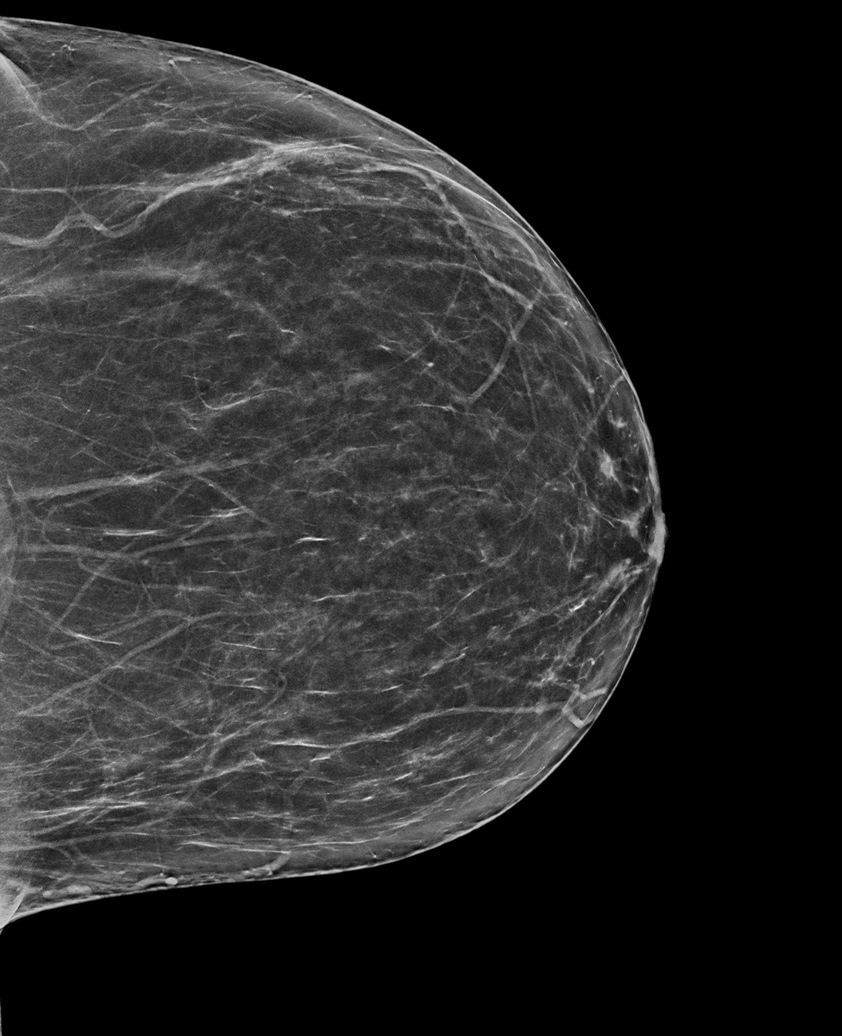

[R CC synth-2D (1 of 2)]
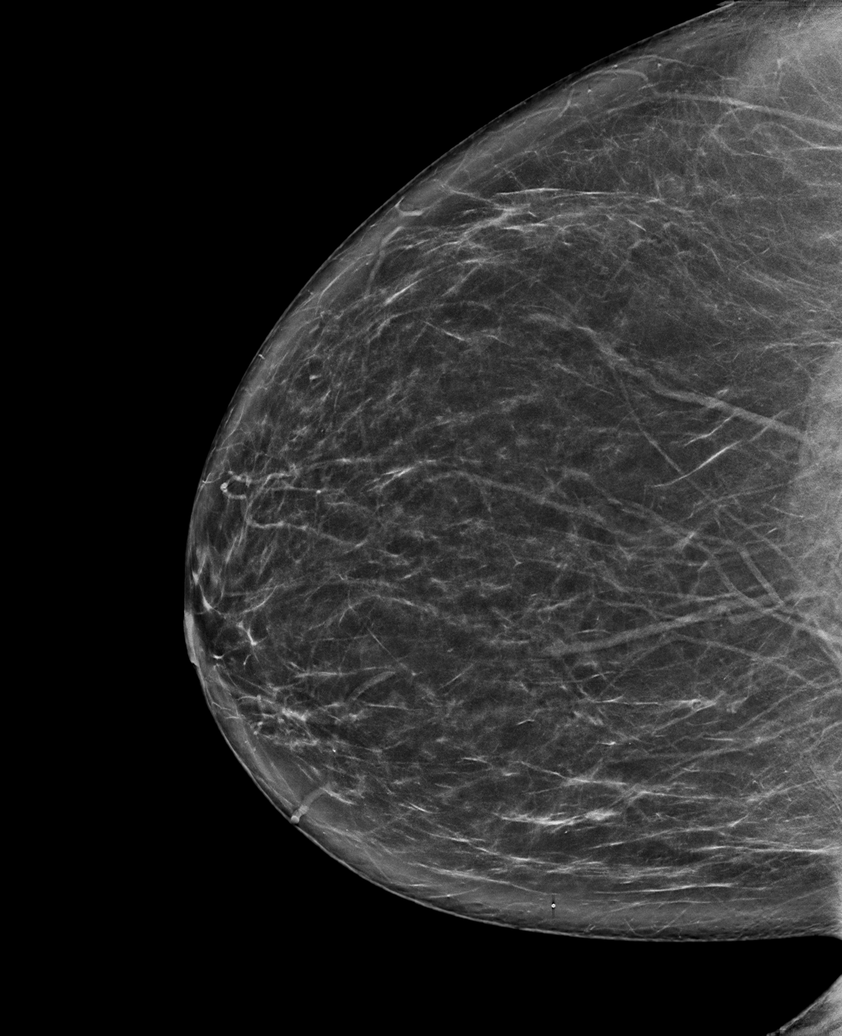

[L MLO synth-2D]
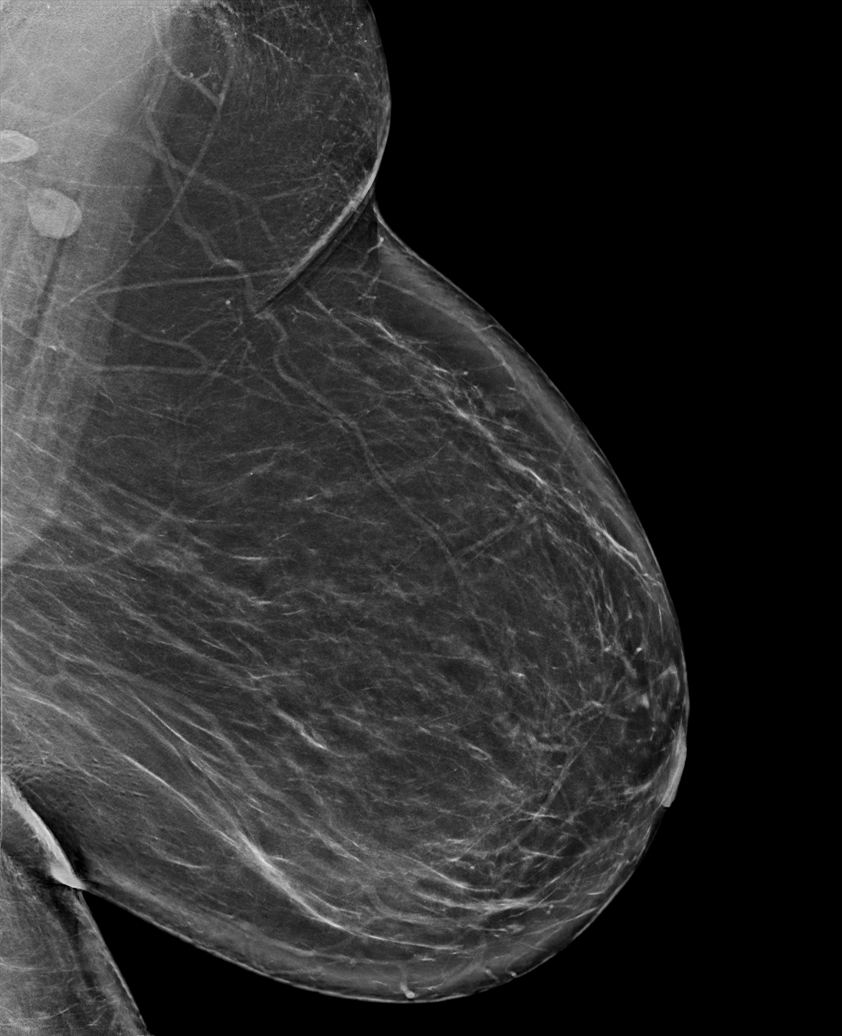

[R CC synth-2D (2 of 2)]
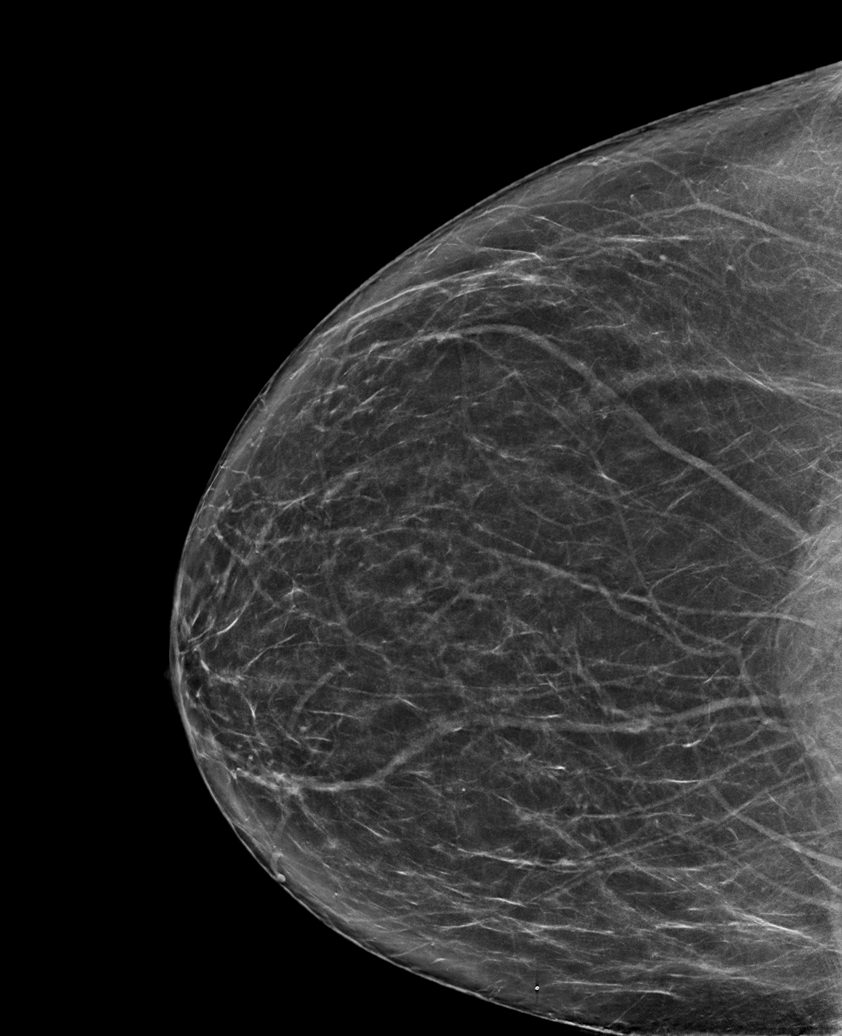

[L CC tomo · tomo slice 39/78.0]
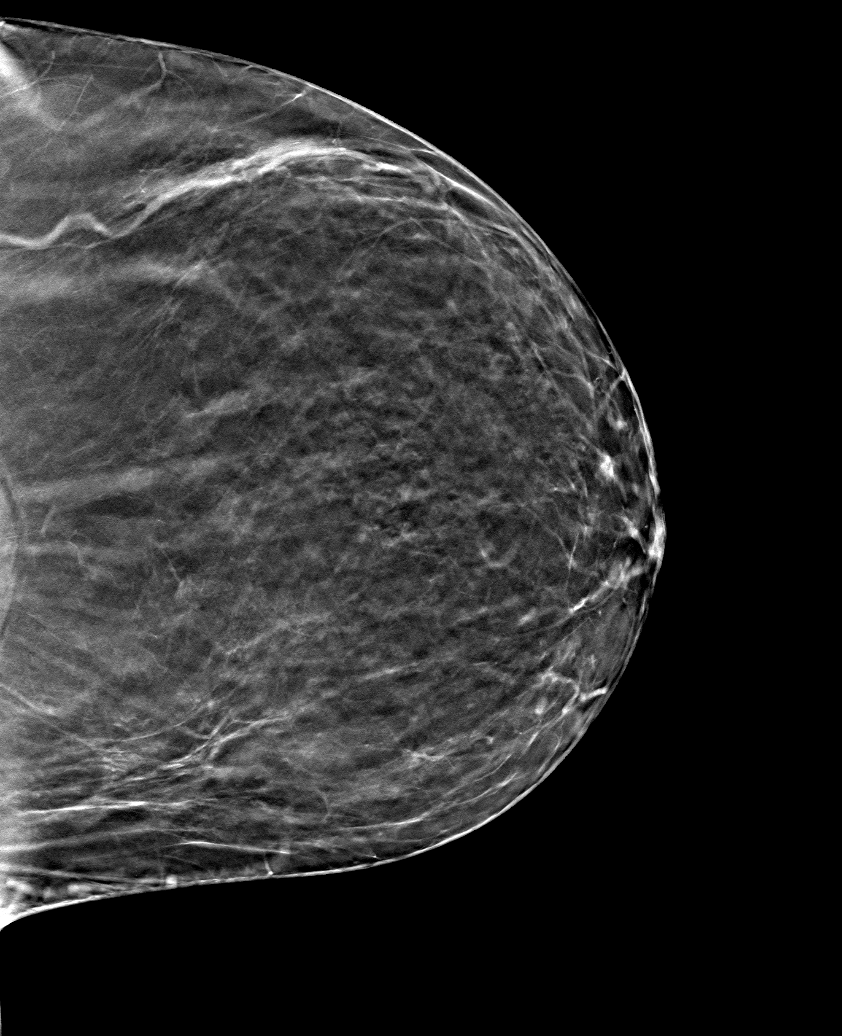

[6 of 30 positions shown; findings below may reference images not displayed]

ACR Breast Density Category b: There are scattered areas of
fibroglandular density.
FINDINGS: There are no findings suspicious for malignancy. Images were
processed with CAD.
IMPRESSION: No mammographic evidence of malignancy. A result letter of this
screening mammogram will be mailed directly to the patient.

RECOMMENDATION:
Screening mammogram in one year. (Code:CN-U-775)

BI-RADS CATEGORY  1: Negative.

## 2021-05-12 ENCOUNTER — Emergency Department
Admission: EM | Admit: 2021-05-12 | Discharge: 2021-05-12 | Disposition: A | Discharge status: Home / Self Care | Payer: Self-pay | Attending: Emergency Medicine | Admitting: Emergency Medicine

## 2021-05-12 ENCOUNTER — Other Ambulatory Visit: Payer: Self-pay

## 2021-05-12 DIAGNOSIS — J45909 Unspecified asthma, uncomplicated: Secondary | ICD-10-CM | POA: Insufficient documentation

## 2021-05-12 DIAGNOSIS — R739 Hyperglycemia, unspecified: Secondary | ICD-10-CM

## 2021-05-12 DIAGNOSIS — E119 Type 2 diabetes mellitus without complications: Secondary | ICD-10-CM

## 2021-05-12 DIAGNOSIS — I10 Essential (primary) hypertension: Secondary | ICD-10-CM | POA: Insufficient documentation

## 2021-05-12 DIAGNOSIS — Z79899 Other long term (current) drug therapy: Secondary | ICD-10-CM | POA: Insufficient documentation

## 2021-05-12 DIAGNOSIS — R002 Palpitations: Secondary | ICD-10-CM | POA: Insufficient documentation

## 2021-05-12 DIAGNOSIS — E1165 Type 2 diabetes mellitus with hyperglycemia: Secondary | ICD-10-CM | POA: Insufficient documentation

## 2021-05-12 LAB — URINALYSIS, COMPLETE (UACMP) WITH MICROSCOPIC
Bacteria, UA: NONE SEEN
Bilirubin Urine: NEGATIVE
Glucose, UA: >=500 mg/dL — AB
Hgb urine dipstick: NEGATIVE
Ketones, ur: NEGATIVE mg/dL
Leukocytes,Ua: NEGATIVE
Nitrite: NEGATIVE
Protein, ur: NEGATIVE mg/dL
Specific Gravity, Urine: 1.006 (ref 1.005–1.030)
pH: 6 (ref 5.0–8.0)

## 2021-05-12 LAB — COMPREHENSIVE METABOLIC PANEL
ALT: 33 U/L (ref 0–44)
AST: 43 U/L — ABNORMAL HIGH (ref 15–41)
Albumin: 3.9 g/dL (ref 3.5–5.0)
Alkaline Phosphatase: 96 U/L (ref 38–126)
Anion gap: 9 (ref 5–15)
BUN: 6 mg/dL (ref 6–20)
CO2: 24 mmol/L (ref 22–32)
Calcium: 9.1 mg/dL (ref 8.9–10.3)
Chloride: 103 mmol/L (ref 98–111)
Creatinine, Ser: 0.62 mg/dL (ref 0.44–1.00)
GFR, Estimated: >60 mL/min (ref >60)
Glucose, Bld: 292 mg/dL — ABNORMAL HIGH (ref 70–99)
Potassium: 5 mmol/L (ref 3.5–5.1)
Sodium: 136 mmol/L (ref 135–145)
Total Bilirubin: 1.1 mg/dL (ref 0.3–1.2)
Total Protein: 7.8 g/dL (ref 6.5–8.1)

## 2021-05-12 LAB — CBC
HCT: 44.5 % (ref 36.0–46.0)
Hemoglobin: 14.4 g/dL (ref 12.0–15.0)
MCH: 26.1 pg (ref 26.0–34.0)
MCHC: 32.4 g/dL (ref 30.0–36.0)
MCV: 80.6 fL (ref 80.0–100.0)
Platelets: 258 10*3/uL (ref 150–400)
RBC: 5.52 MIL/uL — ABNORMAL HIGH (ref 3.87–5.11)
RDW: 13.1 % (ref 11.5–15.5)
WBC: 5.1 10*3/uL (ref 4.0–10.5)
nRBC: 0 % (ref 0.0–0.2)

## 2021-05-12 LAB — CBG MONITORING, ED
Glucose-Capillary: 281 mg/dL — ABNORMAL HIGH (ref 70–99)
Glucose-Capillary: 227 mg/dL — ABNORMAL HIGH (ref 70–99)

## 2021-05-12 LAB — TROPONIN I (HIGH SENSITIVITY)
Troponin I (High Sensitivity): 3 ng/L (ref <18)
Troponin I (High Sensitivity): 3 ng/L (ref <18)

## 2021-05-12 LAB — T4, FREE: Free T4: 1.15 ng/dL — ABNORMAL HIGH (ref 0.61–1.12)

## 2021-05-12 LAB — TSH: TSH: 1.569 u[IU]/mL (ref 0.350–4.500)

## 2021-05-12 MED ORDER — LIVING WELL WITH DIABETES BOOK
Freq: Once | Status: DC
  Filled 2021-05-12: qty 1

## 2021-05-12 NOTE — ED Triage Notes (Signed)
Pt in with co hyperglycemia for few days, states has been in the 300's which is unusual for her. Pt also co palpitations at night, and inability to sleep.

## 2021-05-12 NOTE — ED Provider Notes (Signed)
[facility] Emergency Department Provider Note  ____________________________________________   Event Date/Time   First MD Initiated Contact with Patient 05/12/21 [phone]     (approximate)  I have reviewed the triage vital signs and the nursing notes.   HISTORY  Chief Complaint Hyperglycemia    HPI Stacy Dixon is a 60 y.o. female with diabetes, HTN who comes in with high sugars.  Patient reports that she has been compliant with her insulin.  She takes insulin long-acting at nighttime, 30 units, metformin and Januvia.  Patient does report increased stress at work and may be not having the healthiest diet the last few days.  She states that yesterday morning she took her sugar and it was low in the 60s although she had no symptoms.  She repeated it and it was still low.  She then ate half a banana and rechecked her sugars and it was up to 300 so she was not sure why that happened.  Since then throughout the day it been higher in the 300s.  She stated that she took her normal medications and got home from work and then started to have some palpitations feeling sweaty and feeling like she is having difficulty sleeping.  Patient has unfortunately been in the waiting room for over 7 hours and since then she states that her her symptoms have since resolved and she is feeling better.  On review of records in April 2021 patient had very similar symptoms and was told to follow-up with cardiology.      Past Medical History:  Diagnosis Date   Anemia    Asthma    Diabetes mellitus without complication (HCC)    GERD (gastroesophageal reflux disease)    Heart murmur    Some question of a history of A.Fib, but on no control meds and patient is in NSR on ED EKG   Hypertension     Patient Active Problem List   Diagnosis Date Noted   Choledocholithiasis 03/07/2014   Cholecystitis, acute 03/07/2014   Choledocholithiasis with obstruction 03/06/2014   Transaminitis  03/06/2014   Cholelithiasis 03/06/2014   HTN (hypertension) 03/06/2014   Unspecified asthma, with exacerbation 03/06/2014    Past Surgical History:  Procedure Laterality Date   CHOLECYSTECTOMY N/A 03/07/2014   Procedure: LAPAROSCOPIC CHOLECYSTECTOMY ;  Surgeon: Liz MaladyBurke E Thompson, MD;  [facility];  Service: General;  Laterality: N/A;   ERCP N/A 03/07/2014   Procedure: ENDOSCOPIC RETROGRADE CHOLANGIOPANCREATOGRAPHY (ERCP);  Surgeon: Rachael Feeaniel P Jacobs, MD;  [facility];  Service: Endoscopy;  Laterality: N/A;  we are planning to do this at same time as cholecystectomy.    TUBAL LIGATION  1995   UTERINE FIBROID SURGERY  2013    Prior to Admission medications   Medication Sig Start Date End Date Taking? Authorizing Provider  albuterol (PROVENTIL HFA;VENTOLIN HFA) 108 (90 BASE) MCG/ACT inhaler Inhale 2 puffs into the lungs every 6 (six) hours as needed for wheezing or shortness of breath.    [provider]  beclomethasone (QVAR) 80 MCG/ACT inhaler Inhale 2 puffs into the lungs daily as needed (for wheezing and shortness of breath).    [provider]  cetirizine (ZYRTEC) 10 MG tablet Take 10 mg by mouth daily.    [provider]  ciprofloxacin (CIPRO) 500 MG tablet Take 1 tablet (500 mg total) by mouth 2 (two) times daily. 03/08/14   Catarina Hartshornat, David, MD  IRON PO Take 1 tablet by mouth daily.    [provider]  lisinopril-hydrochlorothiazide (PRINZIDE,ZESTORETIC) 20-25 MG per tablet Take 1 tablet by mouth daily.    [provider]  metroNIDAZOLE (FLAGYL) 500 MG tablet Take 1 tablet (500 mg total) by mouth every 8 (eight) hours. 03/08/14   Catarina Hartshorn, MD  montelukast (SINGULAIR) 10 MG tablet Take 10 mg by mouth at bedtime.    [provider]  ondansetron (ZOFRAN ODT) 4 MG disintegrating tablet Take 1 tablet (4 mg total) by mouth every 8 (eight) hours as needed for nausea or vomiting. 03/04/20   Emily Filbert, MD  ranitidine (ZANTAC) 300 MG  tablet Take 300 mg by mouth at bedtime.    [provider]  simvastatin (ZOCOR) 40 MG tablet Take 40 mg by mouth every evening.    [provider]  traZODone (DESYREL) 50 MG tablet Take 50 mg by mouth at bedtime as needed for sleep.    [provider]    Allergies Prednisone  Family History  Problem Relation Age of Onset   Breast cancer Cousin 19    Social History Social History   Tobacco Use   Smoking status: Never   Smokeless tobacco: Never  Substance Use Topics   Alcohol use: Yes    Comment: socially   Drug use: No      Review of Systems Constitutional: No fever/chills, sweaty, elevated sugars Eyes: No visual changes. ENT: No sore throat. Cardiovascular: Denies chest pain.  Palpitation Respiratory: Denies shortness of breath. Gastrointestinal: No abdominal pain.  No nausea, no vomiting.  No diarrhea.  No constipation. Genitourinary: Negative for dysuria. Musculoskeletal: Negative for back pain. Skin: Negative for rash. Neurological: Negative for headaches, focal weakness or numbness. All other ROS negative ____________________________________________   PHYSICAL EXAM:  VITAL SIGNS: ED Triage Vitals  Enc Vitals Group     BP 05/12/21 0130 (!) 165/92     Pulse Rate 05/12/21 0130 91     Resp 05/12/21 0130 20     Temp 05/12/21 0130 98.2 F (36.8 C)     Temp Source 05/12/21 0130 Oral     SpO2 05/12/21 0130 98 %     Weight 05/12/21 0131 169 lb (76.7 kg)     Height 05/12/21 0131 5' (1.524 m)     Head Circumference --      Peak Flow --      Pain Score 05/12/21 0130 7     Pain Loc --      Pain Edu? --      Excl. in [place]? --     Constitutional: Alert and oriented. Well appearing and in no acute distress. Eyes: Conjunctivae are normal. EOMI. Head: Atraumatic. Nose: No congestion/rhinnorhea. Mouth/Throat: Mucous membranes are moist.   Neck: No stridor. Trachea Midline. FROM Cardiovascular: Normal rate, regular rhythm. Grossly normal  heart sounds.  Good peripheral circulation. Respiratory: Normal respiratory effort.  No retractions. Lungs CTAB. Gastrointestinal: Soft and nontender. No distention. No abdominal bruits.  Musculoskeletal: No lower extremity tenderness nor edema.  No joint effusions. Neurologic:  Normal speech and language. No gross focal neurologic deficits are appreciated.  Cranial nerves II through XII are intact.  Equal strength in arms and legs. Skin:  Skin is warm, dry and intact. No rash noted. Psychiatric: Mood and affect are normal. Speech and behavior are normal. GU: Deferred   ____________________________________________   LABS (all labs ordered are listed, but only abnormal results are displayed)  Labs Reviewed  CBC - Abnormal; Notable for the following components:  Result Value   RBC 5.52 (*)    All other components within normal limits  COMPREHENSIVE METABOLIC PANEL - Abnormal; Notable for the following components:   Glucose, Bld 292 (*)    AST 43 (*)    All other components within normal limits  URINALYSIS, COMPLETE (UACMP) WITH MICROSCOPIC - Abnormal; Notable for the following components:   Color, Urine STRAW (*)    APPearance CLEAR (*)    Glucose, UA >=500 (*)    All other components within normal limits  CBG MONITORING, ED - Abnormal; Notable for the following components:   Glucose-Capillary 281 (*)    All other components within normal limits  TROPONIN I (HIGH SENSITIVITY)  TROPONIN I (HIGH SENSITIVITY)   ____________________________________________   ED ECG REPORT I, Concha Se, the attending physician, personally viewed and interpreted this ECG.  Normal sinus rate 92, no ST elevation, no T wave versions, normal intervals ____________________________________________     PROCEDURES  Procedure(s) performed (including Critical Care):  Procedures   ____________________________________________   INITIAL IMPRESSION / ASSESSMENT AND PLAN / ED COURSE  Stacy Dixon was evaluated in Emergency Department on 05/12/2021 for the symptoms described in the history of present illness. She was evaluated in the context of the global COVID-19 pandemic, which necessitated consideration that the patient might be at risk for infection with the SARS-CoV-2 virus that causes COVID-19. Institutional protocols and algorithms that pertain to the evaluation of patients at risk for COVID-19 are in a state of rapid change based on information released by regulatory bodies including the CDC and federal and state organizations. These policies and algorithms were followed during the patient's care in the ED.    Patient is a well-appearing 60 year old with normal vital signs who comes in with palpitations and elevated sugars.  Labs ordered to evaluate for ACS, UTI, electrolyte abnormalities, AKI, DKA  No evidence of UTI, labs are reassuring.  Sugars are elevated in the 200s but no anion gap elevation to suggest DKA.  Patient is currently asymptomatic at this time.  Did not want to change her medications around due to having a low sugar in the morning do not want to risk hypoglycemia.  Patient will call her primary care doctor today to get follow-up next week and she stated that she will record her sugars the next few days.  Also recommended her following up with cardiology for continued palpitations in the setting of potentially having A. fib in the past.  Patient may need Holter monitor  Patient's thyroid testing are still in process but patient has been in the ER for over 8 hours.  Not feel if she has thyroid storm therefore patient feels comfortable with prefer to follow-up in MyChart for her results.  Repeat sugar no drop this AM- still in 200s.   I discussed the provisional nature of ED diagnosis, the treatment so far, the ongoing plan of care, follow up appointments and return precautions with the patient and any family or support people present. They expressed understanding and  agreed with the plan, discharged home.          ____________________________________________   FINAL CLINICAL IMPRESSION(S) / ED DIAGNOSES   Final diagnoses:  Hyperglycemia  Type 2 diabetes mellitus without complication, with long-term current use of insulin (HCC)  Palpitations      MEDICATIONS GIVEN DURING THIS VISIT:  Medications - No data to display   ED Discharge Orders     None  Note:  This document was prepared using Dragon voice recognition software and may include unintentional dictation errors.    Concha Se, MD 05/12/21 [phone]

## 2021-05-12 NOTE — Progress Notes (Signed)
Inpatient Diabetes Program Recommendations  AACE/ADA: New Consensus Statement on Inpatient Glycemic Control (2015)  Target Ranges:  Prepandial:   less than 140 mg/dL      Peak postprandial:   less than 180 mg/dL (1-2 hours)      Critically ill patients:  140 - 180 mg/dL   Lab Results  Component Value Date   GLUCAP 227 (H) 05/12/2021    Review of Glycemic Control Results for [name], Stacy Dixon (MRN [id number]) as of 05/12/2021 09:48  Ref. Range 05/12/2021 01:40 05/12/2021 09:15  Glucose-Capillary Latest Ref Range: 70 - 99 mg/dL 297 (H) 989 (H)   Diabetes history: DM2 Outpatient Diabetes medications:  Lantus 30 units QHS Januvia 100 mg QD Metformin 500 mg BID   Spoke with patient over the phone.  Confirms above home medications.  She is current with Gorden Harms, PA who manages her DM2.  She was diagnosed 1 year ago with DM2 and was started on Lantus , Metformin and Jardiance.  Jardiance caused UTI's so the was DC'd and Januvia was started.  Patient states her CBG's have been elevated the last few days and her fasting CBG was 59 mg/dL yesterday.  She states she was sweaty and shaky and the time and called her sister.  Her sister told her to have something to eat and give herself insulin.    Educated patient on hypoglycemia, signs, symptoms and treatment.  Explained not to administer insulin when her blood sugar is low.  She should drinks 4-6 oz of juice and recheck her blood sugar in 15 mins.  Repeat if CBG is not > 80 mg/dL.    She seems to need more education on DM2.  Ordered LWWD booklet and reviewed The Plate Method, CHO's and exercise.  She has been drinking regular Ginger-Ale and eating a lot of bread and sweets.   She checks her cbg's in the morning, mid-afternoon and before bedtime.    She is interested in outpatient DM education.  MD-please order prior to DC.  Attached education to AVS.    Will continue to follow while inpatient.  Thank you, Dulce Sellar, RN, BSN Diabetes  Coordinator Inpatient Diabetes Program [phone] (team pager from 8a-5p)

## 2021-05-12 NOTE — Discharge Instructions (Addendum)
Call cardiology to get a follow-up for Holter monitor. Call your primary care doctor to get a follow-up for your sugars.  Keep a record of your sugars over the next few days to see if they continue to to fluctuate. Your thyroid test are still pending and these be followed up on MyChart. Discuss these with primary doc if abnormal.

## 2021-05-12 NOTE — ED Notes (Signed)
Patient verbalizes understanding of discharge instructions. Opportunity for questioning and answers were provided. Armband removed by staff, pt discharged from ED. Ambulated out to lobby  

## 2023-01-30 ENCOUNTER — Emergency Department: Payer: Medicaid Other

## 2023-01-30 ENCOUNTER — Other Ambulatory Visit: Payer: Self-pay

## 2023-01-30 ENCOUNTER — Emergency Department
Admission: EM | Admit: 2023-01-30 | Discharge: 2023-01-30 | Disposition: A | Discharge status: Home / Self Care | Payer: Medicaid Other | Attending: Emergency Medicine | Admitting: Emergency Medicine

## 2023-01-30 ENCOUNTER — Encounter: Payer: Self-pay | Admitting: Emergency Medicine

## 2023-01-30 DIAGNOSIS — R519 Headache, unspecified: Secondary | ICD-10-CM | POA: Diagnosis not present

## 2023-01-30 DIAGNOSIS — R42 Dizziness and giddiness: Secondary | ICD-10-CM | POA: Diagnosis not present

## 2023-01-30 DIAGNOSIS — E1165 Type 2 diabetes mellitus with hyperglycemia: Secondary | ICD-10-CM | POA: Diagnosis not present

## 2023-01-30 DIAGNOSIS — I10 Essential (primary) hypertension: Secondary | ICD-10-CM | POA: Insufficient documentation

## 2023-01-30 DIAGNOSIS — R739 Hyperglycemia, unspecified: Secondary | ICD-10-CM

## 2023-01-30 LAB — CBC WITH DIFFERENTIAL/PLATELET
Abs Immature Granulocytes: 0.03 10*3/uL (ref 0.00–0.07)
Basophils Absolute: 0 10*3/uL (ref 0.0–0.1)
Basophils Relative: 1 %
Eosinophils Absolute: 0.2 10*3/uL (ref 0.0–0.5)
Eosinophils Relative: 4 %
HCT: 51 % — ABNORMAL HIGH (ref 36.0–46.0)
Hemoglobin: 15.6 g/dL — ABNORMAL HIGH (ref 12.0–15.0)
Immature Granulocytes: 1 %
Lymphocytes Relative: 40 %
Lymphs Abs: 2.1 10*3/uL (ref 0.7–4.0)
MCH: 26 pg (ref 26.0–34.0)
MCHC: 30.6 g/dL (ref 30.0–36.0)
MCV: 85.1 fL (ref 80.0–100.0)
Monocytes Absolute: 0.4 10*3/uL (ref 0.1–1.0)
Monocytes Relative: 8 %
Neutro Abs: 2.5 10*3/uL (ref 1.7–7.7)
Neutrophils Relative %: 46 %
Platelets: 265 10*3/uL (ref 150–400)
RBC: 5.99 MIL/uL — ABNORMAL HIGH (ref 3.87–5.11)
RDW: 13 % (ref 11.5–15.5)
WBC: 5.3 10*3/uL (ref 4.0–10.5)
nRBC: 0 % (ref 0.0–0.2)

## 2023-01-30 LAB — URINALYSIS, ROUTINE W REFLEX MICROSCOPIC
Bacteria, UA: NONE SEEN
Bilirubin Urine: NEGATIVE
Glucose, UA: >=500 mg/dL — AB
Hgb urine dipstick: NEGATIVE
Ketones, ur: 5 mg/dL — AB
Leukocytes,Ua: NEGATIVE
Nitrite: NEGATIVE
Protein, ur: NEGATIVE mg/dL
Specific Gravity, Urine: 1.008 (ref 1.005–1.030)
pH: 6 (ref 5.0–8.0)

## 2023-01-30 LAB — COMPREHENSIVE METABOLIC PANEL
ALT: 46 U/L — ABNORMAL HIGH (ref 0–44)
AST: 34 U/L (ref 15–41)
Albumin: 4.1 g/dL (ref 3.5–5.0)
Alkaline Phosphatase: 130 U/L — ABNORMAL HIGH (ref 38–126)
Anion gap: 14 (ref 5–15)
BUN: 11 mg/dL (ref 8–23)
CO2: 21 mmol/L — ABNORMAL LOW (ref 22–32)
Calcium: 9.6 mg/dL (ref 8.9–10.3)
Chloride: 100 mmol/L (ref 98–111)
Creatinine, Ser: 0.73 mg/dL (ref 0.44–1.00)
GFR, Estimated: >60 mL/min (ref >60)
Glucose, Bld: 377 mg/dL — ABNORMAL HIGH (ref 70–99)
Potassium: 3.5 mmol/L (ref 3.5–5.1)
Sodium: 135 mmol/L (ref 135–145)
Total Bilirubin: <0.1 mg/dL — ABNORMAL LOW (ref 0.3–1.2)
Total Protein: 7.9 g/dL (ref 6.5–8.1)

## 2023-01-30 LAB — CBG MONITORING, ED
Glucose-Capillary: 393 mg/dL — ABNORMAL HIGH (ref 70–99)
Glucose-Capillary: 253 mg/dL — ABNORMAL HIGH (ref 70–99)

## 2023-01-30 LAB — TROPONIN I (HIGH SENSITIVITY)
Troponin I (High Sensitivity): 4 ng/L (ref <18)
Troponin I (High Sensitivity): 3 ng/L (ref <18)

## 2023-01-30 MED ORDER — MECLIZINE HCL 25 MG PO TABS: 25.0000 mg | ORAL_TABLET | Freq: Three times a day (TID) | ORAL | 0 refills | Status: DC | PRN

## 2023-01-30 MED ORDER — INSULIN GLARGINE 100 UNIT/ML SOLOSTAR PEN: 35.0000 [IU] | PEN_INJECTOR | Freq: Every day | SUBCUTANEOUS | 3 refills | Status: DC

## 2023-01-30 MED ORDER — LACTATED RINGERS IV BOLUS
1000.0000 mL | Freq: Once | INTRAVENOUS | Status: AC
  Administered 2023-01-30: 1000 mL via INTRAVENOUS

## 2023-01-30 MED ORDER — INSULIN ASPART 100 UNIT/ML IJ SOLN
10.0000 [IU] | Freq: Once | INTRAMUSCULAR | Status: AC
  Administered 2023-01-30: 10 [IU] via INTRAVENOUS
  Filled 2023-01-30: qty 1

## 2023-01-30 NOTE — ED Notes (Signed)
Unsuccessful attempt at IV

## 2023-01-30 NOTE — ED Provider Notes (Signed)
[facility] Provider Note    Event Date/Time   First MD Initiated Contact with Patient 01/30/23 [phone]     (approximate)   History   Headache   HPI  Stacy Dixon is a 62 y.o. female who presents to the ED for evaluation of Headache   Patient with a history of HTN and DM presents with concerns for hypertension, hyperglycemia, positional vertigo.  Reports symptoms over the past couple days alongside significantly increasing home psychosocial stressors.  She reports compliance with her medications and no changes to her regimen recently.  Reports last night and tonight she had episodes of vertiginous dizziness when she stood up quickly, improved with holding still.  Reports her vision seemed blurry throughout the entirety of her visual field alongside the vertigo, but also this resolves with the vertigo.  Denies any headache, falls, syncope, fevers or neck stiffness.  Due to the symptoms, she reports checking her blood pressure and noting they were hypertensive with systolic around [id number] so she presents to the ED for evaluation.  Physical Exam   Triage Vital Signs: ED Triage Vitals  Enc Vitals Group     BP 01/30/23 0110 (!) 189/92     Pulse Rate 01/30/23 0110 (!) 123     Resp 01/30/23 0110 18     Temp 01/30/23 0110 97.9 F (36.6 C)     Temp Source 01/30/23 0110 Oral     SpO2 01/30/23 0110 100 %     Weight 01/30/23 0104 167 lb (75.8 kg)     Height 01/30/23 0104 '5\' 1"'$  (1.549 m)     Head Circumference --      Peak Flow --      Pain Score 01/30/23 0111 5     Pain Loc --      Pain Edu? --      Excl. in [place]? --     Most recent vital signs: Vitals:   01/30/23 0430 01/30/23 0459  BP: 134/79   Pulse: 95   Resp: 15   Temp:  98.3 F (36.8 C)  SpO2: 100%     General: Awake, no distress.  CV:  Good peripheral perfusion.  Resp:  Normal effort.  Abd:  No distention.  MSK:  No deformity noted.  Neuro:  No focal deficits appreciated. Cranial nerves II  through XII intact 5/5 strength and sensation in all 4 extremities Other:  Ambulatory independently. No meningismus    ED Results / Procedures / Treatments   Labs (all labs ordered are listed, but only abnormal results are displayed) Labs Reviewed  CBC WITH DIFFERENTIAL/PLATELET - Abnormal; Notable for the following components:      Result Value   RBC 5.99 (*)    Hemoglobin 15.6 (*)    HCT 51.0 (*)    All other components within normal limits  COMPREHENSIVE METABOLIC PANEL - Abnormal; Notable for the following components:   CO2 21 (*)    Glucose, Bld 377 (*)    ALT 46 (*)    Alkaline Phosphatase 130 (*)    Total Bilirubin <0.1 (*)    All other components within normal limits  URINALYSIS, ROUTINE W REFLEX MICROSCOPIC - Abnormal; Notable for the following components:   Color, Urine STRAW (*)    APPearance CLEAR (*)    Glucose, UA >=500 (*)    Ketones, ur 5 (*)    All other components within normal limits  CBG MONITORING, ED - Abnormal; Notable for the following components:  Glucose-Capillary 393 (*)    All other components within normal limits  CBG MONITORING, ED - Abnormal; Notable for the following components:   Glucose-Capillary 253 (*)    All other components within normal limits  TROPONIN I (HIGH SENSITIVITY)  TROPONIN I (HIGH SENSITIVITY)    EKG Sinus rhythm with a rate of 108 bpm.  Normal axis and intervals.  No clear signs of acute ischemia.  Nonspecific J-point depressions  RADIOLOGY CT head interpreted by me without evidence of acute intracranial pathology  Official radiology report(s): CT HEAD WO CONTRAST (5MM)  Result Date: 01/30/2023 CLINICAL DATA:  Hypertension and headache. EXAM: CT HEAD WITHOUT CONTRAST TECHNIQUE: Contiguous axial images were obtained from the base of the skull through the vertex without intravenous contrast. RADIATION DOSE REDUCTION: This exam was performed according to the departmental dose-optimization program which includes automated  exposure control, adjustment of the mA and/or kV according to patient size and/or use of iterative reconstruction technique. COMPARISON:  None Available. FINDINGS: Brain: No evidence of acute infarction, hemorrhage, hydrocephalus, extra-axial collection or mass lesion/mass effect. Vascular: No hyperdense vessel or unexpected calcification. Skull: Normal. Negative for fracture or focal lesion. Sinuses/Orbits: No acute finding. Other: None. IMPRESSION: No acute intracranial pathology. Electronically Signed   By: Virgina Norfolk M.D.   On: 01/30/2023 03:33    PROCEDURES and INTERVENTIONS:  .1-3 Lead EKG Interpretation  Performed by: Vladimir Crofts, MD Authorized by: Vladimir Crofts, MD     Interpretation: normal     ECG rate:  94   ECG rate assessment: normal     Rhythm: sinus rhythm     Ectopy: none     Conduction: normal     Medications  insulin aspart (novoLOG) injection 10 Units (10 Units Intravenous Given 01/30/23 0348)  lactated ringers bolus 1,000 mL (0 mLs Intravenous Stopped 01/30/23 0459)     IMPRESSION / MDM / ASSESSMENT AND PLAN / ED COURSE  I reviewed the triage vital signs and the nursing notes.  Differential diagnosis includes, but is not limited to, hyperglycemia, DKA, BPPV, central vertigo, sepsis, medication noncompliance  {Patient presents with symptoms of an acute illness or injury that is potentially life-threatening.  Pleasant 62 year old presents with a couple days of intermittent vertigo hyperglycemia and hypertension.  Looks well without neurologic deficits and I doubt stroke or central causes of her vertigo.  She has an essentially normal CBC.  No leukocytosis.  Metabolic panel hyperglycemia without acidosis to suggest DKA.  No anion gap.  Ketones are noted in her urine.  She is provided 10 units of IV insulin and IV fluids bolus with improvement of her hyperglycemia and resolution of her symptoms.  CT head is clear.  Troponins are negative.  I considered observation  admission for this patient, but with the quick resolution of her symptoms and her looking quite well, I think a trial of outpatient management is reasonable.  I provided a prescription for meclizine as well as some refills for her Lantus that she was running short on.  We discussed PCP follow-up as well as return precautions for the ED.  Clinical Course as of 01/30/23 0511  Wed Jan 30, 2023  0444 Reassessed.  Feeling better. [DS]  [id number] I helped as nursing staff helped get her up to the bathroom.  She reports feeling a lot better. Ambulatory independently [DS]    Clinical Course User Index [DS] Vladimir Crofts, MD     FINAL CLINICAL IMPRESSION(S) / ED DIAGNOSES   Final diagnoses:  Vertigo  Hyperglycemia     Rx / DC Orders   ED Discharge Orders          Ordered    insulin glargine (LANTUS) 100 UNIT/ML Solostar Pen  Daily        01/30/23 0457    meclizine (ANTIVERT) 25 MG tablet  3 times daily PRN        01/30/23 0458             Note:  This document was prepared using Dragon voice recognition software and may include unintentional dictation errors.   Vladimir Crofts, MD 01/30/23 [phone]

## 2023-01-30 NOTE — Discharge Instructions (Addendum)
A few refills for your Lantus is waiting for you at the pharmacy.  I also sent a prescription for meclizine/Antivert.  This is an as needed medication that you can try to use for any vertigo in the future.  If you develop any further worsening symptoms like passing out, vertigo that will not go away even when seated or other worsening symptoms then please return to the ED.

## 2023-01-30 NOTE — ED Triage Notes (Signed)
Pt presents via POV with complaints of headache with associated HTN that started yesterday. She notes the pain is bilaterally on the sides of her head. Hx of T2DM and notes her CBG being elevated yesterday. Denies dizziness, urinary sx, double vision, CP or SOB.    CBG in triage -393

## 2023-02-06 ENCOUNTER — Ambulatory Visit
Admission: RE | Admit: 2023-02-06 | Discharge: 2023-02-06 | Disposition: A | Discharge status: Home / Self Care | Payer: Medicaid Other | Source: Ambulatory Visit | Attending: Urgent Care | Admitting: Urgent Care

## 2023-02-06 VITALS — BP 185/98 | HR 110 | Temp 98.1°F | Resp 16

## 2023-02-06 DIAGNOSIS — N898 Other specified noninflammatory disorders of vagina: Secondary | ICD-10-CM

## 2023-02-06 DIAGNOSIS — R300 Dysuria: Secondary | ICD-10-CM | POA: Diagnosis not present

## 2023-02-06 LAB — POCT URINALYSIS DIP (MANUAL ENTRY)
Bilirubin, UA: NEGATIVE
Blood, UA: NEGATIVE
Glucose, UA: 500 mg/dL — AB
Ketones, POC UA: NEGATIVE mg/dL
Leukocytes, UA: NEGATIVE
Nitrite, UA: NEGATIVE
Protein Ur, POC: NEGATIVE mg/dL
Spec Grav, UA: 1.01 (ref 1.010–1.025)
Urobilinogen, UA: 0.2 E.U./dL
pH, UA: 6 (ref 5.0–8.0)

## 2023-02-06 NOTE — ED Triage Notes (Signed)
Pt states she is having burning sensation, urinary frequency with vaginal itching, that started last Friday. Not taking any OTC medication right now.

## 2023-02-06 NOTE — ED Provider Notes (Addendum)
Roderic Palau    CSN: [id number] Arrival date & time: 02/06/23  1452      History   Chief Complaint Chief Complaint  Patient presents with   Urinary Frequency    UTI Infection. Burning when peeing and a warm sensation and some itching . Cloudy urine.  I am diabetic was diagnosed in April 2021. - Entered by patient    HPI NIKYRA BAIRD is a 62 y.o. female.    Urinary Frequency    Presents urgent care with concern for possible UTI.  Endorses dysuria and itching.,  Cloudy urine.  PMH includes DM.  Past Medical History:  Diagnosis Date   Anemia    Asthma    Diabetes mellitus without complication (HCC)    GERD (gastroesophageal reflux disease)    Heart murmur    Some question of a history of A.Fib, but on no control meds and patient is in NSR on ED EKG   Hypertension     Patient Active Problem List   Diagnosis Date Noted   Choledocholithiasis 03/07/2014   Cholecystitis, acute 03/07/2014   Choledocholithiasis with obstruction 03/06/2014   Transaminitis 03/06/2014   Cholelithiasis 03/06/2014   HTN (hypertension) 03/06/2014   Unspecified asthma, with exacerbation 03/06/2014    Past Surgical History:  Procedure Laterality Date   CHOLECYSTECTOMY N/A 03/07/2014   Procedure: LAPAROSCOPIC CHOLECYSTECTOMY ;  Surgeon: Zenovia Jarred, MD;  Location: [place];  Service: General;  Laterality: N/A;   ERCP N/A 03/07/2014   Procedure: ENDOSCOPIC RETROGRADE CHOLANGIOPANCREATOGRAPHY (ERCP);  Surgeon: Milus Banister, MD;  Location: [place];  Service: Endoscopy;  Laterality: N/A;  we are planning to do this at same time as cholecystectomy.    [place] SURGERY  2013    OB History   No obstetric history on file.      Home Medications    Prior to Admission medications   Medication Sig Start Date End Date Taking? Authorizing Provider  albuterol (PROVENTIL HFA;VENTOLIN HFA) 108 (90 BASE) MCG/ACT inhaler Inhale 2 puffs into the lungs every 6  (six) hours as needed for wheezing or shortness of breath.    [provider]  beclomethasone (QVAR) 80 MCG/ACT inhaler Inhale 2 puffs into the lungs daily as needed (for wheezing and shortness of breath).    [provider]  cetirizine (ZYRTEC) 10 MG tablet Take 10 mg by mouth daily.    [provider]  ciprofloxacin (CIPRO) 500 MG tablet Take 1 tablet (500 mg total) by mouth 2 (two) times daily. 03/08/14   Orson Eva, MD  insulin glargine (LANTUS) 100 UNIT/ML injection Inject 30 Units into the skin at bedtime.    [provider]  insulin glargine (LANTUS) 100 UNIT/ML Solostar Pen Inject 35 Units into the skin daily. 01/30/23   Vladimir Crofts, MD  IRON PO Take 1 tablet by mouth daily.    [provider]  lisinopril-hydrochlorothiazide (PRINZIDE,ZESTORETIC) 20-25 MG per tablet Take 1 tablet by mouth daily.    [provider]  meclizine (ANTIVERT) 25 MG tablet Take 1 tablet (25 mg total) by mouth 3 (three) times daily as needed for dizziness. 01/30/23   Vladimir Crofts, MD  metFORMIN (GLUCOPHAGE) 500 MG tablet Take by mouth 2 (two) times daily with a meal.    [provider]  metroNIDAZOLE (FLAGYL) 500 MG tablet Take 1 tablet (500 mg total) by mouth every 8 (eight) hours. 03/08/14   Orson Eva, MD  montelukast (SINGULAIR) 10  MG tablet Take 10 mg by mouth at bedtime.    [provider]  ondansetron (ZOFRAN ODT) 4 MG disintegrating tablet Take 1 tablet (4 mg total) by mouth every 8 (eight) hours as needed for nausea or vomiting. 03/04/20   Earleen Newport, MD  ranitidine (ZANTAC) 300 MG tablet Take 300 mg by mouth at bedtime.    [provider]  simvastatin (ZOCOR) 40 MG tablet Take 40 mg by mouth every evening.    [provider]  sitaGLIPtin (JANUVIA) 100 MG tablet Take 100 mg by mouth daily.    [provider]  traZODone (DESYREL) 50 MG tablet Take 50 mg by mouth at bedtime as needed for sleep.    [provider]    Family History Family History  Problem Relation Age of Onset   Breast cancer Cousin 52    Social History Social History   Tobacco Use   Smoking status: Never   Smokeless tobacco: Never  Substance Use Topics   Alcohol use: Yes    Comment: socially   Drug use: No     Allergies   Prednisone   Review of Systems Review of Systems  Genitourinary:  Positive for frequency.     Physical Exam Triage Vital Signs ED Triage Vitals  Enc Vitals Group     BP      Pulse      Resp      Temp      Temp src      SpO2      Weight      Height      Head Circumference      Peak Flow      Pain Score      Pain Loc      Pain Edu?      Excl. in [place]?    No data found.  Updated Vital Signs LMP 06/01/2016 (Approximate)   Visual Acuity Right Eye Distance:   Left Eye Distance:   Bilateral Distance:    Right Eye Near:   Left Eye Near:    Bilateral Near:     Physical Exam Vitals reviewed.  Constitutional:      Appearance: Normal appearance.  Skin:    General: Skin is warm and dry.  Neurological:     General: No focal deficit present.     Mental Status: She is alert and oriented to person, place, and time.  Psychiatric:        Mood and Affect: Mood normal.        Behavior: Behavior normal.      UC Treatments / Results  Labs (all labs ordered are listed, but only abnormal results are displayed) Labs Reviewed - No data to display  EKG   Radiology No results found.  Procedures Procedures (including critical care time)  Medications Ordered in UC Medications - No data to display  Initial Impression / Assessment and Plan / UC Course  I have reviewed the triage vital signs and the nursing notes.  Pertinent labs & imaging results that were available during my care of the patient were reviewed by me and considered in my medical decision making (see chart for details).   UA is not indicative of UTI.  No leukocytes.  No nitrites.  Positive  glucosuria resulting in frequency.  Will obtain vag swab for suspected yeast.   Final Clinical Impressions(s) / UC Diagnoses   Final diagnoses:  None   Discharge Instructions   None  ED Prescriptions   None    PDMP not reviewed this encounter.   Rose Phi, [place] 02/06/23 1542    [place], [place] 02/06/23 1552

## 2023-02-06 NOTE — Discharge Instructions (Signed)
Follow up here or with your primary care provider if your symptoms are worsening or not improving.    

## 2023-02-07 ENCOUNTER — Telehealth: Payer: Self-pay | Admitting: Emergency Medicine

## 2023-02-07 LAB — URINE CULTURE: Culture: <10000 — AB

## 2023-02-07 LAB — CERVICOVAGINAL ANCILLARY ONLY
Bacterial Vaginitis (gardnerella): POSITIVE — AB
Candida Glabrata: NEGATIVE
Candida Vaginitis: POSITIVE — AB
Chlamydia: NEGATIVE
Comment: NEGATIVE
Comment: NEGATIVE
Comment: NEGATIVE
Comment: NEGATIVE
Comment: NEGATIVE
Comment: NORMAL
Neisseria Gonorrhea: NEGATIVE
Trichomonas: NEGATIVE

## 2023-02-07 MED ORDER — METRONIDAZOLE 500 MG PO TABS: 500.0000 mg | ORAL_TABLET | Freq: Two times a day (BID) | ORAL | 0 refills | Status: DC

## 2023-02-07 MED ORDER — FLUCONAZOLE 150 MG PO TABS: 150.0000 mg | ORAL_TABLET | Freq: Once | ORAL | 0 refills | Status: AC

## 2023-02-21 DIAGNOSIS — J329 Chronic sinusitis, unspecified: Secondary | ICD-10-CM | POA: Diagnosis not present

## 2023-03-22 ENCOUNTER — Ambulatory Visit: Payer: Medicaid Other | Admitting: Nurse Practitioner

## 2023-03-29 ENCOUNTER — Other Ambulatory Visit: Payer: Self-pay | Admitting: Nurse Practitioner

## 2023-03-29 ENCOUNTER — Encounter: Payer: Self-pay | Admitting: Nurse Practitioner

## 2023-03-29 ENCOUNTER — Ambulatory Visit (INDEPENDENT_AMBULATORY_CARE_PROVIDER_SITE_OTHER): Payer: Medicaid Other | Admitting: Nurse Practitioner

## 2023-03-29 VITALS — BP 130/70 | HR 99 | Temp 97.8°F | Resp 16 | Ht 61.0 in | Wt 175.1 lb

## 2023-03-29 DIAGNOSIS — Z7985 Long-term (current) use of injectable non-insulin antidiabetic drugs: Secondary | ICD-10-CM | POA: Diagnosis not present

## 2023-03-29 DIAGNOSIS — E1159 Type 2 diabetes mellitus with other circulatory complications: Secondary | ICD-10-CM

## 2023-03-29 DIAGNOSIS — Z1231 Encounter for screening mammogram for malignant neoplasm of breast: Secondary | ICD-10-CM

## 2023-03-29 DIAGNOSIS — Z794 Long term (current) use of insulin: Secondary | ICD-10-CM

## 2023-03-29 DIAGNOSIS — I152 Hypertension secondary to endocrine disorders: Secondary | ICD-10-CM | POA: Diagnosis not present

## 2023-03-29 DIAGNOSIS — E1169 Type 2 diabetes mellitus with other specified complication: Secondary | ICD-10-CM | POA: Insufficient documentation

## 2023-03-29 DIAGNOSIS — E1165 Type 2 diabetes mellitus with hyperglycemia: Secondary | ICD-10-CM | POA: Diagnosis not present

## 2023-03-29 DIAGNOSIS — E785 Hyperlipidemia, unspecified: Secondary | ICD-10-CM

## 2023-03-29 DIAGNOSIS — J4520 Mild intermittent asthma, uncomplicated: Secondary | ICD-10-CM | POA: Diagnosis not present

## 2023-03-29 DIAGNOSIS — Z1211 Encounter for screening for malignant neoplasm of colon: Secondary | ICD-10-CM

## 2023-03-29 LAB — MICROALBUMIN / CREATININE URINE RATIO
Creatinine,U: 46.7 mg/dL
Microalb Creat Ratio: 1.5 mg/g (ref 0.0–30.0)
Microalb, Ur: <0.7 mg/dL (ref 0.0–1.9)

## 2023-03-29 LAB — COMPREHENSIVE METABOLIC PANEL
ALT: 31 U/L (ref 0–35)
AST: 26 U/L (ref 0–37)
Albumin: 4.5 g/dL (ref 3.5–5.2)
Alkaline Phosphatase: 122 U/L — ABNORMAL HIGH (ref 39–117)
BUN: 8 mg/dL (ref 6–23)
CO2: 22 mEq/L (ref 19–32)
Calcium: 9.8 mg/dL (ref 8.4–10.5)
Chloride: 97 mEq/L (ref 96–112)
Creatinine, Ser: 1.06 mg/dL (ref 0.40–1.20)
GFR: 56.7 mL/min — ABNORMAL LOW (ref >60.00)
Glucose, Bld: 389 mg/dL — ABNORMAL HIGH (ref 70–99)
Potassium: 3.5 mEq/L (ref 3.5–5.1)
Sodium: 133 mEq/L — ABNORMAL LOW (ref 135–145)
Total Bilirubin: 0.4 mg/dL (ref 0.2–1.2)
Total Protein: 8 g/dL (ref 6.0–8.3)

## 2023-03-29 LAB — CBC
HCT: 45.2 % (ref 36.0–46.0)
Hemoglobin: 15.2 g/dL — ABNORMAL HIGH (ref 12.0–15.0)
MCHC: 33.5 g/dL (ref 30.0–36.0)
MCV: 83.4 fl (ref 78.0–100.0)
Platelets: 248 10*3/uL (ref 150.0–400.0)
RBC: 5.42 Mil/uL — ABNORMAL HIGH (ref 3.87–5.11)
RDW: 13.2 % (ref 11.5–15.5)
WBC: 6.3 10*3/uL (ref 4.0–10.5)

## 2023-03-29 LAB — POCT GLYCOSYLATED HEMOGLOBIN (HGB A1C): Hemoglobin A1C: 13.3 % — AB (ref 4.0–5.6)

## 2023-03-29 LAB — TSH: TSH: 0.97 u[IU]/mL (ref 0.35–5.50)

## 2023-03-29 MED ORDER — AMLODIPINE BESYLATE 10 MG PO TABS: 10.0000 mg | ORAL_TABLET | Freq: Every day | ORAL | 1 refills | Status: DC

## 2023-03-29 MED ORDER — TIRZEPATIDE 2.5 MG/0.5ML ~~LOC~~ SOAJ: 2.5000 mg | SUBCUTANEOUS | 0 refills | Status: DC

## 2023-03-29 MED ORDER — BLOOD GLUCOSE TEST VI STRP: 1.0000 | ORAL_STRIP | Freq: Two times a day (BID) | 5 refills | Status: DC

## 2023-03-29 MED ORDER — ALBUTEROL SULFATE HFA 108 (90 BASE) MCG/ACT IN AERS: 2.0000 | INHALATION_SPRAY | Freq: Four times a day (QID) | RESPIRATORY_TRACT | 1 refills | Status: DC | PRN

## 2023-03-29 MED ORDER — BLOOD GLUCOSE MONITORING SUPPL DEVI: 1.0000 | Freq: Three times a day (TID) | 0 refills | Status: AC

## 2023-03-29 MED ORDER — LANCET DEVICE MISC: 1.0000 | Freq: Three times a day (TID) | 0 refills | Status: AC

## 2023-03-29 MED ORDER — LANCETS MISC. MISC: 1.0000 | Freq: Two times a day (BID) | 5 refills | Status: AC

## 2023-03-29 MED ORDER — CETIRIZINE HCL 10 MG PO TABS: 10.0000 mg | ORAL_TABLET | Freq: Every day | ORAL | 3 refills | Status: DC

## 2023-03-29 MED ORDER — ATORVASTATIN CALCIUM 40 MG PO TABS: 40.0000 mg | ORAL_TABLET | Freq: Every day | ORAL | 1 refills | Status: DC

## 2023-03-29 MED ORDER — OZEMPIC (0.25 OR 0.5 MG/DOSE) 2 MG/3ML ~~LOC~~ SOPN: 0.2500 mg | PEN_INJECTOR | SUBCUTANEOUS | 0 refills | Status: DC

## 2023-03-29 NOTE — Assessment & Plan Note (Signed)
Patient currently maintained on amlodipine 10 mg daily along with lisinopril-hydrochlorothiazide 20 mg - 25 mg.  Blood pressure under good control patient tolerates medicines well continue

## 2023-03-29 NOTE — Progress Notes (Signed)
Ozempic

## 2023-03-29 NOTE — Assessment & Plan Note (Signed)
Patient does have Qvar that she uses as needed along with albuterol.  If she uses albuterol more than twice a week in order to go on Qvar daily.  Albuterol inhaler refill provided today

## 2023-03-29 NOTE — Assessment & Plan Note (Signed)
Patient currently maintained on atorvastatin 40 mg.  Refill provided today

## 2023-03-29 NOTE — Progress Notes (Signed)
New Patient Office Visit  Subjective    Patient ID: Stacy Dixon, female    DOB: 29-May-1961  Age: 62 y.o. MRN: [id number]  CC:  Chief Complaint  Patient presents with   Establish Care    HPI Stacy Dixon presents to establish care   HTN: Patient is currently maintained on amlodipine 10, lisinopril-hctz. Does have a cuff at home and does check her blood pressure twice a day.  Patient tolerates medication well per her report  DM2: patient currenlty maintained on lantus 35, januvia 100 States that she does not have strips and has not checked her glucose in a while. States that she does not know what it has been running. Would check it twice a day when she had supplies States that she had one episode of hypoglycemia several years ago after she was diagnosed with diabetes.  Patient states she has tried metformin in the past but could not tolerate it.  States she used to be on Ozempic but since transitioning from the program the office she has not been on it for some time.  Diet: state 2 meals a day with some snacks  Herbal Tea water and sodas.  Asthma: albuterol inhaler as needed. Was on singular in the past but no longer taking it. States that she will use the Qvar as needed a long with albuterol . Once or twice a week   GERD: states that she no longer has a problem with it since losing weight.  Patient endorses she is lost over 100 pounds  Pap Smear: last year [place] [facility]. States that it was normal   Mammogram: [place].  Needs updating  Colonscopy: Never done ambulatory referral placed for [place]  Tdap: unsure Flu: Out of season Covid: up to date Shingles: needs updating, get at local pharmacy or health department   Outpatient Encounter Medications as of 03/29/2023  Medication Sig   ASPIRIN LOW DOSE 81 MG tablet Take 81 mg by mouth daily.   beclomethasone (QVAR) 80 MCG/ACT inhaler Inhale 2 puffs into the lungs daily as needed (for wheezing and  shortness of breath).   Blood Glucose Monitoring Suppl DEVI 1 each by Does not apply route in the morning, at noon, and at bedtime. May substitute to any manufacturer covered by patient's insurance.   Glucose Blood (BLOOD GLUCOSE TEST STRIPS) STRP 1 each by In Vitro route 2 (two) times daily. May substitute to any manufacturer covered by patient's insurance. Has a truplus meter at home   insulin glargine (LANTUS) 100 UNIT/ML injection Inject 30 Units into the skin at bedtime.   insulin glargine (LANTUS) 100 UNIT/ML Solostar Pen Inject 35 Units into the skin daily.   Lancet Device MISC 1 each by Does not apply route in the morning, at noon, and at bedtime. May substitute to any manufacturer covered by patient's insurance.   Lancets Misc. MISC 1 each by Does not apply route in the morning and at bedtime. May substitute to any manufacturer covered by patient's insurance.   lisinopril-hydrochlorothiazide (PRINZIDE,ZESTORETIC) 20-25 MG per tablet Take 1 tablet by mouth daily.   meclizine (ANTIVERT) 25 MG tablet Take 1 tablet (25 mg total) by mouth 3 (three) times daily as needed for dizziness.   tirzepatide [facility]) 2.5 MG/0.5ML Pen Inject 2.5 mg into the skin once a week.   [DISCONTINUED] albuterol (PROVENTIL HFA;VENTOLIN HFA) 108 (90 BASE) MCG/ACT inhaler Inhale 2 puffs into the lungs every 6 (six) hours as needed for wheezing or shortness of breath.   [  DISCONTINUED] amLODipine (NORVASC) 10 MG tablet Take 10 mg by mouth daily.   [DISCONTINUED] atorvastatin (LIPITOR) 40 MG tablet Take 40 mg by mouth daily.   [DISCONTINUED] cetirizine (ZYRTEC) 10 MG tablet Take 10 mg by mouth daily.   [DISCONTINUED] sitaGLIPtin (JANUVIA) 100 MG tablet Take 100 mg by mouth daily.   albuterol (VENTOLIN HFA) 108 (90 Base) MCG/ACT inhaler Inhale 2 puffs into the lungs every 6 (six) hours as needed for wheezing or shortness of breath.   amLODipine (NORVASC) 10 MG tablet Take 1 tablet (10 mg total) by mouth daily.    atorvastatin (LIPITOR) 40 MG tablet Take 1 tablet (40 mg total) by mouth daily.   cetirizine (ZYRTEC) 10 MG tablet Take 1 tablet (10 mg total) by mouth daily.   ciprofloxacin (CIPRO) 500 MG tablet Take 1 tablet (500 mg total) by mouth 2 (two) times daily. (Patient not taking: Reported on 03/29/2023)   IRON PO Take 1 tablet by mouth daily. (Patient not taking: Reported on 03/29/2023)   metFORMIN (GLUCOPHAGE) 500 MG tablet Take by mouth 2 (two) times daily with a meal. (Patient not taking: Reported on 03/29/2023)   metroNIDAZOLE (FLAGYL) 500 MG tablet Take 1 tablet (500 mg total) by mouth 2 (two) times daily. (Patient not taking: Reported on 03/29/2023)   montelukast (SINGULAIR) 10 MG tablet Take 10 mg by mouth at bedtime. (Patient not taking: Reported on 03/29/2023)   ondansetron (ZOFRAN ODT) 4 MG disintegrating tablet Take 1 tablet (4 mg total) by mouth every 8 (eight) hours as needed for nausea or vomiting. (Patient not taking: Reported on 03/29/2023)   ranitidine (ZANTAC) 300 MG tablet Take 300 mg by mouth at bedtime. (Patient not taking: Reported on 03/29/2023)   traZODone (DESYREL) 50 MG tablet Take 50 mg by mouth at bedtime as needed for sleep. (Patient not taking: Reported on 03/29/2023)   [DISCONTINUED] simvastatin (ZOCOR) 40 MG tablet Take 40 mg by mouth every evening. (Patient not taking: Reported on 03/29/2023)   No facility-administered encounter medications on file as of 03/29/2023.    Past Medical History:  Diagnosis Date   Anemia    Asthma    Diabetes mellitus without complication (HCC)    GERD (gastroesophageal reflux disease)    Heart murmur    Some question of a history of A.Fib, but on no control meds and patient is in NSR on ED EKG   Hypertension     Past Surgical History:  Procedure Laterality Date   CHOLECYSTECTOMY N/A 03/07/2014   Procedure: LAPAROSCOPIC CHOLECYSTECTOMY ;  Surgeon: Liz Malady, MD;  Location: [facility] OR;  Service: General;  Laterality: N/A;   ERCP N/A 03/07/2014    Procedure: ENDOSCOPIC RETROGRADE CHOLANGIOPANCREATOGRAPHY (ERCP);  Surgeon: Rachael Fee, MD;  Location: [facility] OR;  Service: Endoscopy;  Laterality: N/A;  we are planning to do this at same time as cholecystectomy.    TUBAL LIGATION  1995   UTERINE FIBROID SURGERY  2013    Family History  Problem Relation Age of Onset   Lung cancer Mother    Lung cancer Father    Breast cancer Cousin 61    Social History   Socioeconomic History   Marital status: Divorced    Spouse name: Not on file   Number of children: 3   Years of education: Not on file   Highest education level: Not on file  Occupational History   Not on file  Tobacco Use   Smoking status: Never   Smokeless tobacco: Never  Vaping  Use   Vaping Use: Never used  Substance and Sexual Activity   Alcohol use: Yes    Comment: socially   Drug use: No   Sexual activity: Yes  Other Topics Concern   Not on file  Social History Narrative   Currenlty not back to work       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  Respiratory:  Negative for shortness of breath.   Cardiovascular:  Negative for chest pain and leg swelling.  Gastrointestinal:  Negative for abdominal pain, blood in stool, constipation, diarrhea, nausea and vomiting.       BM every other day   Genitourinary:  Negative for dysuria and hematuria.  Neurological:  Positive for tingling (feet intermittently). Negative for headaches.  Psychiatric/Behavioral:  Negative for hallucinations and suicidal ideas.         Objective    BP 130/70   Pulse 99   Temp 97.8 F (36.6 C)    Resp 16   Ht 5\' 1"  (1.549 m)   Wt 175 lb 2 oz (79.4 kg)   LMP 06/01/2016 (Approximate)   SpO2 99%   BMI 33.09 kg/m   Physical Exam Vitals and nursing note reviewed.  Constitutional:      Appearance: Normal appearance.  HENT:     Right Ear: Tympanic membrane, ear canal and external ear normal.     Left Ear: Tympanic membrane, ear canal and external ear normal.     Mouth/Throat:     Mouth: Mucous membranes are moist.     Pharynx: Oropharynx is clear.  Eyes:     Extraocular Movements: Extraocular movements intact.     Pupils: Pupils are equal, round, and reactive to light.  Cardiovascular:     Rate and Rhythm: Normal rate and regular rhythm.     Heart sounds: Normal heart sounds.  Pulmonary:     Effort: Pulmonary effort is normal.     Breath sounds: Normal breath sounds.  Musculoskeletal:     Right lower leg: No edema.     Left lower leg: No edema.  Lymphadenopathy:     Cervical: No cervical adenopathy.  Skin:    General: Skin is warm.  Neurological:     Mental Status: She is alert.    Diabetic Foot Form - Detailed   Diabetic Foot Exam - detailed Diabetic Foot exam was performed with the following findings: Yes 03/29/2023 12:32 PM  Is there swelling or and abnormal foot shape?: No Is there a claw toe deformity?: No Is there elevated skin temparature?: No Pulse Foot Exam completed.: Yes   Right posterior Tibialias: Present Left posterior Tibialias: Present   Right Dorsalis Pedis: Present Left Dorsalis Pedis: Present  Sensory Foot Exam Completed.: Yes Semmes-Weinstein Monofilament Test   Comments: All 10 sites sensation intact bilaterally          Assessment & Plan:   Problem List Items Addressed This Visit       Cardiovascular and Mediastinum   Hypertension associated with diabetes [facility])    Patient currently maintained  on amlodipine 10 mg daily along with lisinopril-hydrochlorothiazide 20 mg - 25 mg.  Blood pressure under good control patient tolerates  medicines well continue      Relevant Medications   amLODipine (NORVASC) 10 MG tablet   atorvastatin (LIPITOR) 40 MG tablet   tirzepatide (MOUNJARO) 2.5 MG/0.5ML Pen   Other Relevant Orders   CBC   Comprehensive metabolic panel   TSH     Respiratory   Mild intermittent asthma without complication    Patient does have Qvar that she uses as needed along with albuterol.  If she uses albuterol more than twice a week in order to go on Qvar daily.  Albuterol inhaler refill provided today      Relevant Medications   albuterol (VENTOLIN HFA) 108 (90 Base) MCG/ACT inhaler     Endocrine   Uncontrolled type 2 diabetes mellitus with hyperglycemia (HCC) - Primary    Patient currently maintained on Lantus 35 units and Januvia 100 mg.  Patient has tried metformin and failed due to intolerable side effects of GI upset with diarrhea.  Patient also tried [place] but had recurrent yeast infections.  We will discontinue Januvia and start Mounjaro 2.5 mg once a week.  Continue Lantus 35 units daily.  Supplies and will check glucose twice a day      Relevant Medications   atorvastatin (LIPITOR) 40 MG tablet   tirzepatide (MOUNJARO) 2.5 MG/0.5ML Pen   Blood Glucose Monitoring Suppl DEVI   Glucose Blood (BLOOD GLUCOSE TEST STRIPS) STRP   Lancet Device MISC   Lancets Misc. MISC   Other Relevant Orders   POCT glycosylated hemoglobin (Hb A1C) (Completed)   CBC   Comprehensive metabolic panel   Microalbumin / creatinine urine ratio   Hyperlipidemia associated with type 2 diabetes mellitus (HCC)    Patient currently maintained on atorvastatin 40 mg.  Refill provided today      Relevant Medications   amLODipine (NORVASC) 10 MG tablet   atorvastatin (LIPITOR) 40 MG tablet   tirzepatide (MOUNJARO) 2.5 MG/0.5ML Pen   Other Relevant Orders   CBC   Comprehensive metabolic panel   TSH   Other Visit Diagnoses     Screening mammogram for breast cancer       Relevant Orders   MM 3D SCREENING MAMMOGRAM  BILATERAL BREAST   Screening for colon cancer       Relevant Orders   Ambulatory referral to Gastroenterology       Return in about 3 months (around 06/28/2023) for DM recheck.   Stacy Nine, NP

## 2023-03-29 NOTE — Patient Instructions (Signed)
Nice to see you today I will be in touch with the labs once I have reviewed them  Follow up with me in 3 months, sooner if you need me 

## 2023-03-29 NOTE — Assessment & Plan Note (Signed)
Patient currently maintained on Lantus 35 units and Januvia 100 mg.  Patient has tried metformin and failed due to intolerable side effects of GI upset with diarrhea.  Patient also tried [place] but had recurrent yeast infections.  We will discontinue Januvia and start Mounjaro 2.5 mg once a week.  Continue Lantus 35 units daily.  Supplies and will check glucose twice a day

## 2023-04-02 ENCOUNTER — Telehealth: Payer: Self-pay | Admitting: Nurse Practitioner

## 2023-04-02 ENCOUNTER — Other Ambulatory Visit: Payer: Self-pay

## 2023-04-02 ENCOUNTER — Telehealth: Payer: Self-pay

## 2023-04-02 DIAGNOSIS — Z1211 Encounter for screening for malignant neoplasm of colon: Secondary | ICD-10-CM

## 2023-04-02 MED ORDER — GOLYTELY 236 G PO SOLR: 4000.0000 mL | Freq: Once | ORAL | 0 refills | Status: AC

## 2023-04-02 NOTE — Telephone Encounter (Signed)
Patient has been scheduled

## 2023-04-02 NOTE — Telephone Encounter (Signed)
-----   Message from Eden Emms, NP sent at 04/02/2023  7:19 AM EDT ----- Notified via My Chart   Can we get patient scheduled for a 3 month follow up from her last office visit (03/29/2023)

## 2023-04-02 NOTE — Telephone Encounter (Signed)
LVM for patient to c/b and schedule.  

## 2023-04-02 NOTE — Telephone Encounter (Signed)
Gastroenterology Pre-Procedure Review  Request Date: 04/19/23 Requesting Physician: Dr. Allegra Lai  PATIENT REVIEW QUESTIONS: The patient responded to the following health history questions as indicated:    1. Are you having any GI issues? no 2. Do you have a personal history of Polyps?  No 3. Do you have a family history of Colon Cancer or Polyps? yes (sister) 4. Diabetes Mellitus? yes (has not started Ozempic yet but will note her to stop it 7 days before her colonoscopy.  She has been asked to take half of her usual dose of insulin ) 5. Joint replacements in the past 12 months?no 6. Major health problems in the past 3 months?no 7. Any artificial heart valves, MVP, or defibrillator?no    MEDICATIONS & ALLERGIES:    Patient reports the following regarding taking any anticoagulation/antiplatelet therapy:   Plavix, Coumadin, Eliquis, Xarelto, Lovenox, Pradaxa, Brilinta, or Effient? no Aspirin? yes (81 mg daily)  Patient confirms/reports the following medications:  Current Outpatient Medications  Medication Sig Dispense Refill   albuterol (VENTOLIN HFA) 108 (90 Base) MCG/ACT inhaler Inhale 2 puffs into the lungs every 6 (six) hours as needed for wheezing or shortness of breath. 8 g 1   amLODipine (NORVASC) 10 MG tablet Take 1 tablet (10 mg total) by mouth daily. 90 tablet 1   ASPIRIN LOW DOSE 81 MG tablet Take 81 mg by mouth daily.     atorvastatin (LIPITOR) 40 MG tablet Take 1 tablet (40 mg total) by mouth daily. 90 tablet 1   beclomethasone (QVAR) 80 MCG/ACT inhaler Inhale 2 puffs into the lungs daily as needed (for wheezing and shortness of breath).     Blood Glucose Monitoring Suppl DEVI 1 each by Does not apply route in the morning, at noon, and at bedtime. May substitute to any manufacturer covered by patient's insurance. 1 each 0   cetirizine (ZYRTEC) 10 MG tablet Take 1 tablet (10 mg total) by mouth daily. 90 tablet 3   ciprofloxacin (CIPRO) 500 MG tablet Take 1 tablet (500 mg total)  by mouth 2 (two) times daily. (Patient not taking: Reported on 03/29/2023) 10 tablet 0   Glucose Blood (BLOOD GLUCOSE TEST STRIPS) STRP 1 each by In Vitro route 2 (two) times daily. May substitute to any manufacturer covered by patient's insurance. Has a truplus meter at home 100 strip 5   insulin glargine (LANTUS) 100 UNIT/ML injection Inject 30 Units into the skin at bedtime.     insulin glargine (LANTUS) 100 UNIT/ML Solostar Pen Inject 35 Units into the skin daily. 15 mL 3   IRON PO Take 1 tablet by mouth daily. (Patient not taking: Reported on 03/29/2023)     Lancet Device MISC 1 each by Does not apply route in the morning, at noon, and at bedtime. May substitute to any manufacturer covered by patient's insurance. 1 each 0   Lancets Misc. MISC 1 each by Does not apply route in the morning and at bedtime. May substitute to any manufacturer covered by patient's insurance. 100 each 5   lisinopril-hydrochlorothiazide (PRINZIDE,ZESTORETIC) 20-25 MG per tablet Take 1 tablet by mouth daily.     meclizine (ANTIVERT) 25 MG tablet Take 1 tablet (25 mg total) by mouth 3 (three) times daily as needed for dizziness. 30 tablet 0   metFORMIN (GLUCOPHAGE) 500 MG tablet Take by mouth 2 (two) times daily with a meal. (Patient not taking: Reported on 03/29/2023)     metroNIDAZOLE (FLAGYL) 500 MG tablet Take 1 tablet (500 mg total) by mouth  2 (two) times daily. (Patient not taking: Reported on 03/29/2023) 14 tablet 0   montelukast (SINGULAIR) 10 MG tablet Take 10 mg by mouth at bedtime. (Patient not taking: Reported on 03/29/2023)     ondansetron (ZOFRAN ODT) 4 MG disintegrating tablet Take 1 tablet (4 mg total) by mouth every 8 (eight) hours as needed for nausea or vomiting. (Patient not taking: Reported on 03/29/2023) 20 tablet 0   ranitidine (ZANTAC) 300 MG tablet Take 300 mg by mouth at bedtime. (Patient not taking: Reported on 03/29/2023)     Semaglutide,0.25 or 0.5MG /DOS, (OZEMPIC, 0.25 OR 0.5 MG/DOSE,) 2 MG/3ML SOPN  Inject 0.25 mg into the skin once a week. 3 mL 0   traZODone (DESYREL) 50 MG tablet Take 50 mg by mouth at bedtime as needed for sleep. (Patient not taking: Reported on 03/29/2023)     No current facility-administered medications for this visit.    Patient confirms/reports the following allergies:  Allergies  Allergen Reactions   Prednisone Anaphylaxis    No orders of the defined types were placed in this encounter.   AUTHORIZATION INFORMATION Primary Insurance: 1D#: Group #:  Secondary Insurance: 1D#: Group #:  SCHEDULE INFORMATION: Date: 04/19/23 Time: Location: [facility]

## 2023-04-09 ENCOUNTER — Telehealth: Payer: Self-pay | Admitting: Pharmacy Technician

## 2023-04-09 NOTE — Telephone Encounter (Signed)
Patient Advocate Encounter   Received notification that prior authorization for Ozempic (0.25 or 0.5 MG/DOSE) 2MG /3ML pen-injectors is required.   PA submitted on 04/09/2023 Key [id number] Insurance [employer] Electronic Prior Authorization Form Status is pending

## 2023-04-12 ENCOUNTER — Encounter: Payer: Self-pay | Admitting: Gastroenterology

## 2023-04-19 ENCOUNTER — Ambulatory Visit: Payer: Medicaid Other | Admitting: Anesthesiology

## 2023-04-19 ENCOUNTER — Encounter: Payer: Self-pay | Admitting: Gastroenterology

## 2023-04-19 ENCOUNTER — Ambulatory Visit
Admission: RE | Admit: 2023-04-19 | Discharge: 2023-04-19 | Disposition: A | Discharge status: Home / Self Care | Payer: Medicaid Other | Attending: Gastroenterology | Admitting: Gastroenterology

## 2023-04-19 ENCOUNTER — Encounter
Admission: RE | Disposition: A | Discharge status: Home / Self Care | Payer: Self-pay | Source: Home / Self Care | Attending: Gastroenterology

## 2023-04-19 DIAGNOSIS — I10 Essential (primary) hypertension: Secondary | ICD-10-CM | POA: Diagnosis not present

## 2023-04-19 DIAGNOSIS — K219 Gastro-esophageal reflux disease without esophagitis: Secondary | ICD-10-CM | POA: Insufficient documentation

## 2023-04-19 DIAGNOSIS — K635 Polyp of colon: Secondary | ICD-10-CM | POA: Diagnosis not present

## 2023-04-19 DIAGNOSIS — J45909 Unspecified asthma, uncomplicated: Secondary | ICD-10-CM | POA: Diagnosis not present

## 2023-04-19 DIAGNOSIS — Z7984 Long term (current) use of oral hypoglycemic drugs: Secondary | ICD-10-CM | POA: Insufficient documentation

## 2023-04-19 DIAGNOSIS — Z7951 Long term (current) use of inhaled steroids: Secondary | ICD-10-CM | POA: Insufficient documentation

## 2023-04-19 DIAGNOSIS — Z7985 Long-term (current) use of injectable non-insulin antidiabetic drugs: Secondary | ICD-10-CM | POA: Diagnosis not present

## 2023-04-19 DIAGNOSIS — Z1211 Encounter for screening for malignant neoplasm of colon: Secondary | ICD-10-CM | POA: Diagnosis not present

## 2023-04-19 DIAGNOSIS — Z794 Long term (current) use of insulin: Secondary | ICD-10-CM | POA: Diagnosis not present

## 2023-04-19 DIAGNOSIS — E119 Type 2 diabetes mellitus without complications: Secondary | ICD-10-CM | POA: Diagnosis not present

## 2023-04-19 DIAGNOSIS — D126 Benign neoplasm of colon, unspecified: Secondary | ICD-10-CM | POA: Diagnosis not present

## 2023-04-19 HISTORY — PX: COLONOSCOPY WITH PROPOFOL: SHX5780

## 2023-04-19 LAB — GLUCOSE, CAPILLARY: Glucose-Capillary: 251 mg/dL — ABNORMAL HIGH (ref 70–99)

## 2023-04-19 SURGERY — COLONOSCOPY WITH PROPOFOL
Anesthesia: General

## 2023-04-19 MED ORDER — PROPOFOL 10 MG/ML IV BOLUS
INTRAVENOUS | Status: DC | PRN
  Administered 2023-04-19: 100 mg via INTRAVENOUS

## 2023-04-19 MED ORDER — SODIUM CHLORIDE 0.9 % IV SOLN: INTRAVENOUS | Status: DC

## 2023-04-19 MED ORDER — PROPOFOL 500 MG/50ML IV EMUL
INTRAVENOUS | Status: DC | PRN
  Administered 2023-04-19: 120 ug/kg/min via INTRAVENOUS

## 2023-04-19 MED ORDER — LIDOCAINE HCL (CARDIAC) PF 100 MG/5ML IV SOSY
PREFILLED_SYRINGE | INTRAVENOUS | Status: DC | PRN
  Administered 2023-04-19: 50 mg via INTRAVENOUS

## 2023-04-19 NOTE — H&P (Signed)
[name], MD [address]  [address]  [place], [place] [address]  Main: [phone]  Fax: [phone] Pager: [phone]  Primary Care Physician:  Eden Emms, NP Primary Gastroenterologist:  Dr. [name]  Pre-Procedure History & Physical: HPI:  Stacy Dixon is a 62 y.o. female is here for a colonoscopy.   Past Medical History:  Diagnosis Date   Anemia    Asthma    Diabetes mellitus without complication (HCC)    GERD (gastroesophageal reflux disease)    Heart murmur    Some question of a history of A.Fib, but on no control meds and patient is in NSR on ED EKG   Hypertension     Past Surgical History:  Procedure Laterality Date   CESAREAN SECTION     CHOLECYSTECTOMY N/A 03/07/2014   Procedure: LAPAROSCOPIC CHOLECYSTECTOMY ;  Surgeon: Liz Malady, MD;  Location: [facility] OR;  Service: General;  Laterality: N/A;   ERCP N/A 03/07/2014   Procedure: ENDOSCOPIC RETROGRADE CHOLANGIOPANCREATOGRAPHY (ERCP);  Surgeon: Rachael Fee, MD;  Location: [facility] OR;  Service: Endoscopy;  Laterality: N/A;  we are planning to do this at same time as cholecystectomy.    TUBAL LIGATION  12/03/1993   UTERINE FIBROID SURGERY  12/04/2011    Prior to Admission medications   Medication Sig Start Date End Date Taking? Authorizing Provider  albuterol (VENTOLIN HFA) 108 (90 Base) MCG/ACT inhaler Inhale 2 puffs into the lungs every 6 (six) hours as needed for wheezing or shortness of breath. 03/29/23   Eden Emms, NP  amLODipine (NORVASC) 10 MG tablet Take 1 tablet (10 mg total) by mouth daily. 03/29/23   Eden Emms, NP  ASPIRIN LOW DOSE 81 MG tablet Take 81 mg by mouth daily.    [provider]  atorvastatin (LIPITOR) 40 MG tablet Take 1 tablet (40 mg total) by mouth daily. 03/29/23   Eden Emms, NP  beclomethasone (QVAR) 80 MCG/ACT inhaler Inhale 2 puffs into the lungs daily as needed (for wheezing and shortness of breath).    [provider]  Blood  Glucose Monitoring Suppl DEVI 1 each by Does not apply route in the morning, at noon, and at bedtime. May substitute to any manufacturer covered by patient's insurance. 03/29/23   Eden Emms, NP  cetirizine (ZYRTEC) 10 MG tablet Take 1 tablet (10 mg total) by mouth daily. 03/29/23   Eden Emms, NP  ciprofloxacin (CIPRO) 500 MG tablet Take 1 tablet (500 mg total) by mouth 2 (two) times daily. 03/08/14   Catarina Hartshorn, MD  Glucose Blood (BLOOD GLUCOSE TEST STRIPS) STRP 1 each by In Vitro route 2 (two) times daily. May substitute to any manufacturer covered by patient's insurance. Has a truplus meter at home 03/29/23   Eden Emms, NP  insulin glargine (LANTUS) 100 UNIT/ML injection Inject 30 Units into the skin at bedtime.    [provider]  insulin glargine (LANTUS) 100 UNIT/ML Solostar Pen Inject 35 Units into the skin daily. 01/30/23   Delton Prairie, MD  IRON PO Take 1 tablet by mouth daily.    [provider]  Lancet Device MISC 1 each by Does not apply route in the morning, at noon, and at bedtime. May substitute to any manufacturer covered by patient's insurance. 03/29/23 04/28/23  Eden Emms, NP  Lancets Misc. MISC 1 each by Does not apply route in the morning and at bedtime. May substitute to any manufacturer covered by  patient's insurance. 03/29/23   Eden Emms, NP  lisinopril-hydrochlorothiazide (PRINZIDE,ZESTORETIC) 20-25 MG per tablet Take 1 tablet by mouth daily.    [provider]  meclizine (ANTIVERT) 25 MG tablet Take 1 tablet (25 mg total) by mouth 3 (three) times daily as needed for dizziness. 01/30/23   Delton Prairie, MD  metFORMIN (GLUCOPHAGE) 500 MG tablet Take by mouth 2 (two) times daily with a meal.    [provider]  metroNIDAZOLE (FLAGYL) 500 MG tablet Take 1 tablet (500 mg total) by mouth 2 (two) times daily. 02/07/23   Immordino, Jeannett Senior, FNP  montelukast (SINGULAIR) 10 MG tablet Take 10 mg by mouth at bedtime.    [provider]   ondansetron (ZOFRAN ODT) 4 MG disintegrating tablet Take 1 tablet (4 mg total) by mouth every 8 (eight) hours as needed for nausea or vomiting. 03/04/20   Emily Filbert, MD  ranitidine (ZANTAC) 300 MG tablet Take 300 mg by mouth at bedtime.    [provider]  Semaglutide,0.25 or 0.5MG /DOS, (OZEMPIC, 0.25 OR 0.5 MG/DOSE,) 2 MG/3ML SOPN Inject 0.25 mg into the skin once a week. Patient not taking: Reported on 04/02/2023 03/29/23   Eden Emms, NP  traZODone (DESYREL) 50 MG tablet Take 50 mg by mouth at bedtime as needed for sleep.    [provider]    Allergies as of 04/02/2023 - Review Complete 03/29/2023  Allergen Reaction Noted   Prednisone Anaphylaxis 03/06/2014    Family History  Problem Relation Age of Onset   Lung cancer Mother    Lung cancer Father    Breast cancer Cousin 3    Social History   Socioeconomic History   Marital status: Divorced    Spouse name: Not on file   Number of children: 3   Years of education: Not on file   Highest education level: Not on file  Occupational History   Not on file  Tobacco Use   Smoking status: Never   Smokeless tobacco: Never  Vaping Use   Vaping Use: Never used  Substance and Sexual Activity   Alcohol use: Yes    Comment: socially   Drug use: No   Sexual activity: Yes  Other Topics Concern   Not on file  Social History Narrative   Currenlty not back to work       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38)   [place] (30)   Ashleigh tottne (89      One child passed away   Social Determinants of Corporate investment banker Strain: Not on file  Food Insecurity: Not on file  Transportation Needs: Not on file  Physical Activity: Not on file  Stress: Not on file  Social Connections: Not on file  Intimate Partner Violence: Not on file    Review of Systems: See HPI, otherwise negative ROS  Physical Exam: BP (!) 160/86   Pulse 91   Temp 97.6 F (36.4 C) (Temporal)   Resp 18   Ht 5\' 1"  (1.549 m)   Wt 78.5  kg   LMP 06/01/2016 (Approximate)   SpO2 100%   BMI 32.69 kg/m  General:   Alert,  pleasant and cooperative in NAD Head:  Normocephalic and atraumatic. Neck:  Supple; no masses or thyromegaly. Lungs:  Clear throughout to auscultation.    Heart:  Regular rate and rhythm. Abdomen:  Soft, nontender and nondistended. Normal bowel sounds, without guarding, and without rebound.   Neurologic:  Alert and  oriented x4;  grossly normal  neurologically.  Impression/Plan: Stacy Dixon is here for an colonoscopy to be performed for colon cancer screening  Risks, benefits, limitations, and alternatives regarding  colonoscopy have been reviewed with the patient.  Questions have been answered.  All parties agreeable.   Lannette Donath, MD  04/19/2023, 11:29 AM

## 2023-04-19 NOTE — Anesthesia Preprocedure Evaluation (Signed)
Anesthesia Evaluation  Patient identified by MRN, date of birth, ID band Patient awake    Reviewed: Allergy & Precautions, H&P , NPO status , Patient's Chart, lab work & pertinent test results, reviewed documented beta blocker date and time   History of Anesthesia Complications Negative for: history of anesthetic complications  Airway Mallampati: III  TM Distance: >3 FB Neck ROM: full    Dental  (+) Dental Advidsory Given, Chipped, Teeth Intact   Pulmonary neg shortness of breath, asthma , neg COPD, neg recent URI   Pulmonary exam normal breath sounds clear to auscultation       Cardiovascular Exercise Tolerance: Good hypertension, (-) angina (-) Past MI and (-) Cardiac Stents Normal cardiovascular exam(-) dysrhythmias + Valvular Problems/Murmurs  Rhythm:regular Rate:Normal     Neuro/Psych negative neurological ROS  negative psych ROS   GI/Hepatic Neg liver ROS,GERD  ,,  Endo/Other  diabetes    Renal/GU negative Renal ROS  negative genitourinary   Musculoskeletal   Abdominal   Peds  Hematology negative hematology ROS (+)   Anesthesia Other Findings Past Medical History: No date: Anemia No date: Asthma No date: Diabetes mellitus without complication (HCC) No date: GERD (gastroesophageal reflux disease) No date: Heart murmur     Comment:  Some question of a history of A.Fib, but on no control               meds and patient is in NSR on ED EKG No date: Hypertension   Reproductive/Obstetrics negative OB ROS                             Anesthesia Physical Anesthesia Plan  ASA: 2  Anesthesia Plan: General   Post-op Pain Management:    Induction: Intravenous  PONV Risk Score and Plan: 3 and Propofol infusion and TIVA  Airway Management Planned: Natural Airway and Nasal Cannula  Additional Equipment:   Intra-op Plan:   Post-operative Plan:   Informed Consent: I have reviewed  the patients History and Physical, chart, labs and discussed the procedure including the risks, benefits and alternatives for the proposed anesthesia with the patient or authorized representative who has indicated his/her understanding and acceptance.     Dental Advisory Given  Plan Discussed with: Anesthesiologist, CRNA and Surgeon  Anesthesia Plan Comments:        Anesthesia Quick Evaluation

## 2023-04-19 NOTE — Anesthesia Postprocedure Evaluation (Signed)
Anesthesia Post Note  Patient: Stacy Dixon  Procedure(s) Performed: COLONOSCOPY WITH PROPOFOL  Patient location during evaluation: Endoscopy Anesthesia Type: General Level of consciousness: awake and alert Pain management: pain level controlled Vital Signs Assessment: post-procedure vital signs reviewed and stable Respiratory status: spontaneous breathing, nonlabored ventilation, respiratory function stable and patient connected to nasal cannula oxygen Cardiovascular status: blood pressure returned to baseline and stable Postop Assessment: no apparent nausea or vomiting Anesthetic complications: no   No notable events documented.   Last Vitals:  Vitals:   04/19/23 1211 04/19/23 1217  BP: 122/80   Pulse:    Resp:    Temp:  (!) 35.6 C  SpO2:      Last Pain:  Vitals:   04/19/23 1227  TempSrc:   PainSc: 0-No pain                 Lenard Simmer

## 2023-04-19 NOTE — Op Note (Signed)
[facility] Gastroenterology Patient Name: Stacy Dixon Procedure Date: 04/19/2023 11:32 AM MRN: [id number] Account #: [id number] Date of Birth: [birth year] Admit Type: Outpatient Age: 62 Room: [facility] ENDO ROOM 2 Gender: Female Note Status: Finalized Instrument Name: Prentice Docker [id number] Procedure:             Colonoscopy Indications:           Screening for colorectal malignant neoplasm, This is                         the patient's first colonoscopy Providers:             Toney Reil MD, MD Medicines:             General Anesthesia Complications:         No immediate complications. Estimated blood loss: None. Procedure:             Pre-Anesthesia Assessment:                        - Prior to the procedure, a History and Physical was                         performed, and patient medications and allergies were                         reviewed. The patient is competent. The risks and                         benefits of the procedure and the sedation options and                         risks were discussed with the patient. All questions                         were answered and informed consent was obtained.                         Patient identification and proposed procedure were                         verified by the physician, the nurse, the                         anesthesiologist, the anesthetist and the technician                         in the pre-procedure area in the procedure room in the                         endoscopy suite. Mental Status Examination: alert and                         oriented. Airway Examination: normal oropharyngeal                         airway and neck mobility. Respiratory Examination:                         clear to auscultation.  CV Examination: normal.                         Prophylactic Antibiotics: The patient does not require                         prophylactic antibiotics. Prior Anticoagulants: The                          patient has taken no anticoagulant or antiplatelet                         agents. ASA Grade Assessment: II - A patient with mild                         systemic disease. After reviewing the risks and                         benefits, the patient was deemed in satisfactory                         condition to undergo the procedure. The anesthesia                         plan was to use general anesthesia. Immediately prior                         to administration of medications, the patient was                         re-assessed for adequacy to receive sedatives. The                         heart rate, respiratory rate, oxygen saturations,                         blood pressure, adequacy of pulmonary ventilation, and                         response to care were monitored throughout the                         procedure. The physical status of the patient was                         re-assessed after the procedure.                        After obtaining informed consent, the colonoscope was                         passed under direct vision. Throughout the procedure,                         the patient's blood pressure, pulse, and oxygen                         saturations were monitored continuously. The  Colonoscope was introduced through the anus and                         advanced to the the cecum, identified by appendiceal                         orifice and ileocecal valve. The colonoscopy was                         performed without difficulty. The patient tolerated                         the procedure well. The quality of the bowel                         preparation was evaluated using the BBPS [facility] Bowel                         Preparation Scale) with scores of: Right Colon = 3,                         Transverse Colon = 3 and Left Colon = 3 (entire mucosa                         seen well with no residual staining, small fragments                          of stool or opaque liquid). The total BBPS score                         equals 9. The ileocecal valve, appendiceal orifice,                         and rectum were photographed. Findings:      The perianal and digital rectal examinations were normal. Pertinent       negatives include normal sphincter tone and no palpable rectal lesions.      Two sessile polyps were found in the cecum. The polyps were diminutive       in size. These polyps were removed with a jumbo cold forceps. Resection       and retrieval were complete. Estimated blood loss: none.      The exam was otherwise without abnormality.      Unable to perform retroflexion Impression:            - Two diminutive polyps in the cecum, removed with a                         jumbo cold forceps. Resected and retrieved.                        - The examination was otherwise normal. Recommendation:        - Discharge patient to home (with escort).                        - Resume previous diet today.                        -  Continue present medications.                        - Await pathology results.                        - Repeat colonoscopy in 7-10 years for surveillance                         based on pathology results. Procedure Code(s):     --- Professional ---                        [id number], Colonoscopy, flexible; with biopsy, single or                         multiple Diagnosis Code(s):     --- Professional ---                        Z12.11, Encounter for screening for malignant neoplasm                         of colon                        D12.0, Benign neoplasm of cecum CPT copyright 2022 American Medical Association. All rights reserved. The codes documented in this report are preliminary and upon coder review may  be revised to meet current compliance requirements. Dr. Libby Maw Toney Reil MD, MD 04/19/2023 12:04:29 PM This report has been signed electronically. Number of Addenda: 0 Note  Initiated On: 04/19/2023 11:32 AM Scope Withdrawal Time: 0 hours 12 minutes 29 seconds  Total Procedure Duration: 0 hours 16 minutes 24 seconds  Estimated Blood Loss:  Estimated blood loss: none. Estimated blood loss: none.      [facility]

## 2023-04-19 NOTE — Transfer of Care (Signed)
Immediate Anesthesia Transfer of Care Note  Patient: Stacy Dixon  Procedure(s) Performed: COLONOSCOPY WITH PROPOFOL  Patient Location: PACU and Endoscopy Unit  Anesthesia Type:General  Level of Consciousness: drowsy and patient cooperative  Airway & Oxygen Therapy: Patient Spontanous Breathing  Post-op Assessment: Report given to RN and Post -op Vital signs reviewed and stable  Post vital signs: Reviewed and stable  Last Vitals:  Vitals Value Taken Time  BP 113/71 04/19/23 1207  Temp    Pulse 81 04/19/23 1207  Resp 16 04/19/23 1207  SpO2 100 % 04/19/23 1207  Vitals shown include unvalidated device data.  Last Pain:  Vitals:   04/19/23 1207  TempSrc:   PainSc: 0-No pain         Complications: No notable events documented.

## 2023-04-22 ENCOUNTER — Encounter: Payer: Self-pay | Admitting: Gastroenterology

## 2023-04-22 LAB — SURGICAL PATHOLOGY

## 2023-04-24 ENCOUNTER — Encounter: Payer: Self-pay | Admitting: Gastroenterology

## 2023-05-04 ENCOUNTER — Other Ambulatory Visit (HOSPITAL_COMMUNITY): Payer: Self-pay

## 2023-05-04 NOTE — Telephone Encounter (Signed)
Pharmacy Patient Advocate Encounter  Prior Authorization for Ozempic (0.25 or 0.5 MG/DOSE) 2MG /3ML pen-injectors has been APPROVED by [employer] from 04/09/2023 to 04/08/2024.   PA # [id number]  Georga Bora Rx Patient Advocate [phone]

## 2023-05-13 ENCOUNTER — Other Ambulatory Visit: Payer: Self-pay | Admitting: Nurse Practitioner

## 2023-05-13 DIAGNOSIS — E1165 Type 2 diabetes mellitus with hyperglycemia: Secondary | ICD-10-CM

## 2023-05-14 NOTE — Telephone Encounter (Signed)
Called pt and she has been on the .25 with no side effects. She is ok with you going up in dose.

## 2023-05-14 NOTE — Telephone Encounter (Signed)
Can we verify that she has been on the ozmepic 0.25mg  for a month and if so I will increase to 0.5mg 

## 2023-05-17 ENCOUNTER — Ambulatory Visit
Admission: RE | Admit: 2023-05-17 | Discharge: 2023-05-17 | Disposition: A | Discharge status: Home / Self Care | Payer: Medicaid Other | Source: Ambulatory Visit | Attending: Nurse Practitioner | Admitting: Nurse Practitioner

## 2023-05-17 DIAGNOSIS — Z1231 Encounter for screening mammogram for malignant neoplasm of breast: Secondary | ICD-10-CM | POA: Diagnosis not present

## 2023-05-27 ENCOUNTER — Other Ambulatory Visit: Payer: Self-pay | Admitting: Nurse Practitioner

## 2023-05-27 DIAGNOSIS — E1165 Type 2 diabetes mellitus with hyperglycemia: Secondary | ICD-10-CM

## 2023-06-09 ENCOUNTER — Other Ambulatory Visit: Payer: Self-pay | Admitting: Nurse Practitioner

## 2023-06-09 DIAGNOSIS — J4520 Mild intermittent asthma, uncomplicated: Secondary | ICD-10-CM

## 2023-06-10 NOTE — Telephone Encounter (Signed)
Medication: Ventolin HFA 90 mcg Directions: Inhale 2 puffs every 6 hours as needed for wheezing and SOB Last given: 03/29/23 Number refills: 1 Last o/v: 03/29/23 Follow up: 06/28/23 Labs:

## 2023-06-12 ENCOUNTER — Other Ambulatory Visit: Payer: Self-pay | Admitting: Nurse Practitioner

## 2023-06-12 DIAGNOSIS — E1165 Type 2 diabetes mellitus with hyperglycemia: Secondary | ICD-10-CM

## 2023-06-28 ENCOUNTER — Ambulatory Visit (INDEPENDENT_AMBULATORY_CARE_PROVIDER_SITE_OTHER): Payer: Medicaid Other | Admitting: Nurse Practitioner

## 2023-06-28 ENCOUNTER — Encounter: Payer: Self-pay | Admitting: Nurse Practitioner

## 2023-06-28 VITALS — BP 130/80 | HR 97 | Temp 97.8°F | Ht 61.0 in | Wt 176.0 lb

## 2023-06-28 DIAGNOSIS — E1159 Type 2 diabetes mellitus with other circulatory complications: Secondary | ICD-10-CM | POA: Diagnosis not present

## 2023-06-28 DIAGNOSIS — Z7985 Long-term (current) use of injectable non-insulin antidiabetic drugs: Secondary | ICD-10-CM

## 2023-06-28 DIAGNOSIS — E1165 Type 2 diabetes mellitus with hyperglycemia: Secondary | ICD-10-CM | POA: Diagnosis not present

## 2023-06-28 DIAGNOSIS — I152 Hypertension secondary to endocrine disorders: Secondary | ICD-10-CM

## 2023-06-28 DIAGNOSIS — Z794 Long term (current) use of insulin: Secondary | ICD-10-CM

## 2023-06-28 LAB — POCT GLYCOSYLATED HEMOGLOBIN (HGB A1C): Hemoglobin A1C: 11.5 % — AB (ref 4.0–5.6)

## 2023-06-28 MED ORDER — SEMAGLUTIDE (1 MG/DOSE) 4 MG/3ML ~~LOC~~ SOPN
1.0000 mg | PEN_INJECTOR | SUBCUTANEOUS | 0 refills | Status: DC
Start: 2023-06-28 — End: 2023-07-26

## 2023-06-28 MED ORDER — LISINOPRIL-HYDROCHLOROTHIAZIDE 20-12.5 MG PO TABS
1.0000 | ORAL_TABLET | Freq: Every day | ORAL | 1 refills | Status: DC
Start: 2023-06-28 — End: 2024-02-13

## 2023-06-28 MED ORDER — PEN NEEDLES 33G X 4 MM MISC
1.0000 | Freq: Every day | 1 refills | Status: DC
Start: 2023-06-28 — End: 2024-07-06

## 2023-06-28 MED ORDER — ASPIRIN LOW DOSE 81 MG PO TBEC: 81.0000 mg | DELAYED_RELEASE_TABLET | Freq: Every day | ORAL | 1 refills | Status: DC

## 2023-06-28 NOTE — Assessment & Plan Note (Signed)
Patient currently on Lantus 35 units and Ozempic 0.5 mg.  Will increase Ozempic to 1 mg once a week keep Lantus where it sat continue checking blood glucose twice a day follow-up 3 months

## 2023-06-28 NOTE — Progress Notes (Signed)
Established Patient Office Visit  Subjective   Patient ID: Stacy Dixon, female    DOB: 01/14/1961  Age: 62 y.o. MRN: [id number]  Chief Complaint  Patient presents with   Diabetes    Pt states she feels alright since last visit.    Medication Refill    Ampodipine, baby Asprin, lisinopril hydro,       DM2: Patient was seen by me on 03/29/2023 with uncontrolled diabetes.  Patient was on Lantus 35 units and Januvia 100 mg.  In the past she had failed metformin due to see GI side effects and could not tolerate Farxiga due to recurrent yeast infections.  We did discontinue the Januvia and start patient on a GLP-1 receptor agonist.  And continue the Lantus 35 units daily.  Patient was started on Ozempic and currently on 0.5 mg once a week. State that she checking it two times a day. States that her sugars are better.  This morning 160-170  Hypertension: Patient currently maintained on lisinopril-hydrochlorothiazide along with amlodipine 10 mg. Tolerating medications well, she does need a refill    Review of Systems  Constitutional:  Negative for chills and fever.  Respiratory:  Negative for shortness of breath.   Cardiovascular:  Negative for chest pain.  Gastrointestinal:  Negative for abdominal pain, constipation, diarrhea, nausea and vomiting.  Neurological:  Negative for headaches.      Objective:     BP 130/80   Pulse 97   Temp 97.8 F (36.6 C) (Temporal)   Ht 5\' 1"  (1.549 m)   Wt 176 lb (79.8 kg)   LMP 06/01/2016 (Approximate)   SpO2 96%   BMI 33.25 kg/m  BP Readings from Last 3 Encounters:  06/28/23 130/80  04/19/23 122/80  03/29/23 130/70   Wt Readings from Last 3 Encounters:  06/28/23 176 lb (79.8 kg)  04/19/23 173 lb (78.5 kg)  03/29/23 175 lb 2 oz (79.4 kg)      Physical Exam Vitals and nursing note reviewed.  Constitutional:      Appearance: Normal appearance.  Cardiovascular:     Rate and Rhythm: Normal rate and regular rhythm.     Pulses:           Dorsalis pedis pulses are 2+ on the right side and 2+ on the left side.       Posterior tibial pulses are 2+ on the right side and 2+ on the left side.     Heart sounds: Normal heart sounds.  Pulmonary:     Effort: Pulmonary effort is normal.     Breath sounds: Normal breath sounds.  Abdominal:     General: Bowel sounds are normal. There is no distension.     Palpations: There is no mass.     Tenderness: There is no abdominal tenderness.     Hernia: No hernia is present.  Musculoskeletal:     Right lower leg: No edema.     Left lower leg: No edema.  Feet:     Right foot:     Skin integrity: Skin integrity normal.     Left foot:     Skin integrity: Skin integrity normal.  Neurological:     Mental Status: She is alert.      Results for orders placed or performed in visit on 06/28/23  POCT glycosylated hemoglobin (Hb A1C)  Result Value Ref Range   Hemoglobin A1C 11.5 (A) 4.0 - 5.6 %   HbA1c POC (<> result, manual entry)  HbA1c, POC (prediabetic range)     HbA1c, POC (controlled diabetic range)        The ASCVD Risk score (Arnett DK, et al., 2019) failed to calculate for the following reasons:   Cannot find a previous HDL lab   Cannot find a previous total cholesterol lab    Assessment & Plan:   Problem List Items Addressed This Visit       Cardiovascular and Mediastinum   Hypertension associated with diabetes (HCC)    Maintained on lisinopril-hydrochlorothiazide 20-12.5 mg and amlodipine 10 mg.  Tolerating medication well.  Labs within normal limits.  Blood pressure well-controlled.  Refill both medications      Relevant Medications   metoprolol succinate (TOPROL-XL) 25 MG 24 hr tablet   simvastatin (ZOCOR) 40 MG tablet   lisinopril-hydrochlorothiazide (ZESTORETIC) 20-12.5 MG tablet   ASPIRIN LOW DOSE 81 MG tablet   Semaglutide, 1 MG/DOSE, 4 MG/3ML SOPN     Endocrine   Uncontrolled type 2 diabetes mellitus with hyperglycemia (HCC) - Primary     Patient currently on Lantus 35 units and Ozempic 0.5 mg.  Will increase Ozempic to 1 mg once a week keep Lantus where it sat continue checking blood glucose twice a day follow-up 3 months      Relevant Medications   simvastatin (ZOCOR) 40 MG tablet   Insulin Pen Needle (PEN NEEDLES) 33G X 4 MM MISC   lisinopril-hydrochlorothiazide (ZESTORETIC) 20-12.5 MG tablet   ASPIRIN LOW DOSE 81 MG tablet   Semaglutide, 1 MG/DOSE, 4 MG/3ML SOPN   Other Relevant Orders   POCT glycosylated hemoglobin (Hb A1C) (Completed)    Return in about 3 months (around 09/28/2023) for DM recheck.    Audria Nine, NP

## 2023-06-28 NOTE — Assessment & Plan Note (Signed)
Maintained on lisinopril-hydrochlorothiazide 20-12.5 mg and amlodipine 10 mg.  Tolerating medication well.  Labs within normal limits.  Blood pressure well-controlled.  Refill both medications

## 2023-06-28 NOTE — Patient Instructions (Signed)
Nice to see you today I have changed your ozempic to 1mg . Finish what you have and then start the new dose I want to see you in 3 months for a sugar recheck. A1C is trending down keep putting in the work

## 2023-07-11 ENCOUNTER — Other Ambulatory Visit: Payer: Self-pay | Admitting: Nurse Practitioner

## 2023-07-11 DIAGNOSIS — E1165 Type 2 diabetes mellitus with hyperglycemia: Secondary | ICD-10-CM

## 2023-07-23 ENCOUNTER — Other Ambulatory Visit: Payer: Self-pay | Admitting: Nurse Practitioner

## 2023-07-23 DIAGNOSIS — E1165 Type 2 diabetes mellitus with hyperglycemia: Secondary | ICD-10-CM

## 2023-07-26 ENCOUNTER — Telehealth: Payer: Self-pay | Admitting: Nurse Practitioner

## 2023-07-26 DIAGNOSIS — E1165 Type 2 diabetes mellitus with hyperglycemia: Secondary | ICD-10-CM

## 2023-07-26 MED ORDER — INSULIN GLARGINE 100 UNIT/ML SOLOSTAR PEN: 35.0000 [IU] | PEN_INJECTOR | Freq: Every day | SUBCUTANEOUS | 3 refills | Status: DC

## 2023-07-26 MED ORDER — SEMAGLUTIDE (1 MG/DOSE) 4 MG/3ML ~~LOC~~ SOPN
1.0000 mg | PEN_INJECTOR | SUBCUTANEOUS | 0 refills | Status: DC
Start: 2023-07-26 — End: 2023-09-30

## 2023-07-26 NOTE — Telephone Encounter (Signed)
Prescription Request  07/26/2023  LOV: 06/28/2023  What is the name of the medication or equipment? insulin glargine (LANTUS) 100 UNIT/ML Solostar Pen  Semaglutide, 1 MG/DOSE, 4 MG/3ML SOPN  Have you contacted your pharmacy to request a refill? Yes   Which pharmacy would you like this sent to?  [id number] - [place], [place] - [address] [place] [address] Phone: [phone] Fax: [phone]    Patient notified that their request is being sent to the clinical staff for review and that they should receive a response within 2 business days.   Please advise at Mobile [phone] (mobile)

## 2023-07-26 NOTE — Telephone Encounter (Signed)
Medications sent in.

## 2023-09-30 ENCOUNTER — Ambulatory Visit (INDEPENDENT_AMBULATORY_CARE_PROVIDER_SITE_OTHER): Payer: Medicaid Other | Admitting: Nurse Practitioner

## 2023-09-30 VITALS — BP 122/88 | HR 96 | Temp 97.6°F | Ht 61.0 in | Wt 179.8 lb

## 2023-09-30 DIAGNOSIS — E1165 Type 2 diabetes mellitus with hyperglycemia: Secondary | ICD-10-CM | POA: Diagnosis not present

## 2023-09-30 DIAGNOSIS — Z7985 Long-term (current) use of injectable non-insulin antidiabetic drugs: Secondary | ICD-10-CM | POA: Diagnosis not present

## 2023-09-30 DIAGNOSIS — Z794 Long term (current) use of insulin: Secondary | ICD-10-CM

## 2023-09-30 DIAGNOSIS — E1159 Type 2 diabetes mellitus with other circulatory complications: Secondary | ICD-10-CM | POA: Diagnosis not present

## 2023-09-30 DIAGNOSIS — I152 Hypertension secondary to endocrine disorders: Secondary | ICD-10-CM | POA: Diagnosis not present

## 2023-09-30 LAB — CBC
HCT: 44.4 % (ref 36.0–46.0)
Hemoglobin: 13.9 g/dL (ref 12.0–15.0)
MCHC: 31.4 g/dL (ref 30.0–36.0)
MCV: 83.6 fL (ref 78.0–100.0)
Platelets: 264 10*3/uL (ref 150.0–400.0)
RBC: 5.32 Mil/uL — ABNORMAL HIGH (ref 3.87–5.11)
RDW: 13 % (ref 11.5–15.5)
WBC: 4.7 10*3/uL (ref 4.0–10.5)

## 2023-09-30 LAB — COMPREHENSIVE METABOLIC PANEL
ALT: 39 U/L — ABNORMAL HIGH (ref 0–35)
AST: 27 U/L (ref 0–37)
Albumin: 4.2 g/dL (ref 3.5–5.2)
Alkaline Phosphatase: 116 U/L (ref 39–117)
BUN: 7 mg/dL (ref 6–23)
CO2: 28 meq/L (ref 19–32)
Calcium: 9.8 mg/dL (ref 8.4–10.5)
Chloride: 102 meq/L (ref 96–112)
Creatinine, Ser: 0.63 mg/dL (ref 0.40–1.20)
GFR: 95.35 mL/min (ref >60.00)
Glucose, Bld: 154 mg/dL — ABNORMAL HIGH (ref 70–99)
Potassium: 3.6 meq/L (ref 3.5–5.1)
Sodium: 138 meq/L (ref 135–145)
Total Bilirubin: 0.4 mg/dL (ref 0.2–1.2)
Total Protein: 7.6 g/dL (ref 6.0–8.3)

## 2023-09-30 LAB — POCT GLYCOSYLATED HEMOGLOBIN (HGB A1C): Hemoglobin A1C: 9.4 % — AB (ref 4.0–5.6)

## 2023-09-30 MED ORDER — SEMAGLUTIDE (2 MG/DOSE) 8 MG/3ML ~~LOC~~ SOPN
2.0000 mg | PEN_INJECTOR | SUBCUTANEOUS | 1 refills | Status: DC
Start: 2023-09-30 — End: 2024-07-23

## 2023-09-30 NOTE — Patient Instructions (Addendum)
Nice to see you today I will be in touch with the labs once I have them Follow up with me in 3 months for a recheck on your diabetes  Your A1C was 9.4% today

## 2023-09-30 NOTE — Progress Notes (Signed)
Established Patient Office Visit  Subjective   Patient ID: Stacy Dixon, female    DOB: 14-Jul-1961  Age: 62 y.o. MRN: [id number]  Chief Complaint  Patient presents with   Diabetes    Pt states that she has been doing good. Her daughter helps watch her sugar levels. States that during birthday week she has slacked off a little.      DM2: Patient currently maintained on Lantus 35 units daily and Ozempic 1 mg once a week.  Patient history should be checking her glucose twice daily.  Interval history patient has tried and failed metformin and could not do an SGLT2 and catheter due to recurrent yeast infections. Patient's last A1c was 11.5 States that when she went up on the ozempic she did feel some tremors.  But this resolved within 1 to 2 weeks after dose titration. Patient checking glucose twice daily.  She is getting numbers that range from 118-150s. She is in an exercise group that is three times a week. States that she is walking and doing dancing as an exercise.   HTN: Patient currently maintained on metoprolol 25 mg, lisinopril-hydrochlorothiazide 20-12.5 mg, amlodipine 10 mg.  Patient is tolerating medication well  HLD: Patient currently maintained on atorvastatin 40 mg daily.  Patient tolerating medication well    Review of Systems  Constitutional:  Negative for chills and fever.  Respiratory:  Negative for shortness of breath.   Cardiovascular:  Negative for chest pain.  Gastrointestinal:  Negative for abdominal pain, constipation, diarrhea, nausea and vomiting.  Neurological:  Positive for tingling (feet intermittently went from both to the left). Negative for headaches.      Objective:     BP 122/88   Pulse 96   Temp 97.6 F (36.4 C) (Oral)   Ht 5\' 1"  (1.549 m)   Wt 179 lb 12.8 oz (81.6 kg)   LMP 06/01/2016 (Approximate)   SpO2 92%   BMI 33.97 kg/m  BP Readings from Last 3 Encounters:  09/30/23 122/88  06/28/23 130/80  04/19/23 122/80   Wt Readings  from Last 3 Encounters:  09/30/23 179 lb 12.8 oz (81.6 kg)  06/28/23 176 lb (79.8 kg)  04/19/23 173 lb (78.5 kg)   SpO2 Readings from Last 3 Encounters:  09/30/23 92%  06/28/23 96%  04/19/23 98%      Physical Exam Vitals and nursing note reviewed.  Constitutional:      Appearance: Normal appearance.  Cardiovascular:     Rate and Rhythm: Normal rate and regular rhythm.     Heart sounds: Normal heart sounds.  Pulmonary:     Effort: Pulmonary effort is normal.     Breath sounds: Normal breath sounds.  Abdominal:     General: Bowel sounds are normal.  Neurological:     Mental Status: She is alert.      Results for orders placed or performed in visit on 09/30/23  POCT glycosylated hemoglobin (Hb A1C)  Result Value Ref Range   Hemoglobin A1C 9.4 (A) 4.0 - 5.6 %   HbA1c POC (<> result, manual entry)     HbA1c, POC (prediabetic range)     HbA1c, POC (controlled diabetic range)        The ASCVD Risk score (Arnett DK, et al., 2019) failed to calculate for the following reasons:   Cannot find a previous HDL lab   Cannot find a previous total cholesterol lab    Assessment & Plan:   Problem List Items Addressed This Visit  Cardiovascular and Mediastinum   Hypertension associated with diabetes [facility])    Patient currently on metoprolol, lisinopril-HCTZ, amlodipine.  Blood pressure within normal limits.  Continue medication as prescribed      Relevant Medications   Semaglutide, 2 MG/DOSE, 8 MG/3ML SOPN   Other Relevant Orders   CBC   Comprehensive metabolic panel     Endocrine   Uncontrolled type 2 diabetes mellitus with hyperglycemia (HCC) - Primary    Patient currently maintained on Ozempic 1 mg once weekly along with Lantus 35 units daily.  Patient has had a nice reduction in A1c.  Will increase Ozempic from 1 mg once weekly to Ozempic 2 mg once weekly.  Patient will continue Lantus as is continue checking glucose at home      Relevant Medications    Semaglutide, 2 MG/DOSE, 8 MG/3ML SOPN   Other Relevant Orders   POCT glycosylated hemoglobin (Hb A1C) (Completed)   CBC   Comprehensive metabolic panel    Return in about 3 months (around 12/31/2023) for DM recheck.    Audria Nine, NP

## 2023-09-30 NOTE — Assessment & Plan Note (Signed)
Patient currently maintained on Ozempic 1 mg once weekly along with Lantus 35 units daily.  Patient has had a nice reduction in A1c.  Will increase Ozempic from 1 mg once weekly to Ozempic 2 mg once weekly.  Patient will continue Lantus as is continue checking glucose at home

## 2023-09-30 NOTE — Assessment & Plan Note (Signed)
Patient currently on metoprolol, lisinopril-HCTZ, amlodipine.  Blood pressure within normal limits.  Continue medication as prescribed

## 2023-11-10 ENCOUNTER — Other Ambulatory Visit: Payer: Self-pay | Admitting: Nurse Practitioner

## 2023-11-10 DIAGNOSIS — E1165 Type 2 diabetes mellitus with hyperglycemia: Secondary | ICD-10-CM

## 2023-11-11 NOTE — Telephone Encounter (Signed)
Patient should be on the ozempic 2mg  dose. I sent in a [facility] days supply [facility] [place]

## 2023-11-11 NOTE — Telephone Encounter (Signed)
Patient called in to follow up on refill request.  

## 2023-11-12 ENCOUNTER — Other Ambulatory Visit: Payer: Self-pay | Admitting: Nurse Practitioner

## 2023-11-12 DIAGNOSIS — E1165 Type 2 diabetes mellitus with hyperglycemia: Secondary | ICD-10-CM

## 2023-12-31 ENCOUNTER — Ambulatory Visit: Payer: Medicaid Other | Admitting: Nurse Practitioner

## 2023-12-31 VITALS — BP 136/82 | HR 100 | Temp 97.9°F | Ht 61.0 in | Wt 180.0 lb

## 2023-12-31 DIAGNOSIS — Z794 Long term (current) use of insulin: Secondary | ICD-10-CM | POA: Diagnosis not present

## 2023-12-31 DIAGNOSIS — E1165 Type 2 diabetes mellitus with hyperglycemia: Secondary | ICD-10-CM

## 2023-12-31 DIAGNOSIS — Z7985 Long-term (current) use of injectable non-insulin antidiabetic drugs: Secondary | ICD-10-CM | POA: Diagnosis not present

## 2023-12-31 DIAGNOSIS — Z7984 Long term (current) use of oral hypoglycemic drugs: Secondary | ICD-10-CM

## 2023-12-31 LAB — POCT GLYCOSYLATED HEMOGLOBIN (HGB A1C): Hemoglobin A1C: 9.8 % — AB (ref 4.0–5.6)

## 2023-12-31 MED ORDER — GLIPIZIDE ER 5 MG PO TB24: 5.0000 mg | ORAL_TABLET | Freq: Every day | ORAL | 1 refills | Status: DC

## 2023-12-31 NOTE — Progress Notes (Signed)
Established Patient Office Visit  Subjective   Patient ID: Stacy Dixon, female    DOB: Apr 06, 1961  Age: 63 y.o. MRN: [id number]  Chief Complaint  Patient presents with   Diabetes      DM2: Patient was seen on 09/30/2023 regards to her diabetes.  At that juncture she was maintained on Ozempic 1 mg and Lantus 35 units daily.  Increase Ozempic from 1 mg to 2 mg.  Patient will continue Lantus at 35 units.  Patient is here for follow-up. Patient traditionally checks her glucose twice daily. States that in the morning she is getting 112-129 and at night she is getting around 160 She is an exercise group that is 3 times a week She also walks and dances for exercise  Patient is having difficulty with her living situation.  Recently gone through divorce.  Patient neighbors are all night making noise and makes it difficult for her to rest.  She thinks this is taking a toll on her ability to manage diabetes    Review of Systems  Constitutional:  Negative for chills and fever.  Respiratory:  Negative for shortness of breath.   Cardiovascular:  Negative for chest pain.  Gastrointestinal:  Negative for abdominal pain, constipation, diarrhea, nausea and vomiting.  Neurological:  Negative for headaches.  Psychiatric/Behavioral:  Negative for hallucinations and suicidal ideas.       Objective:     BP 136/82   Pulse 100   Temp 97.9 F (36.6 C) (Oral)   Ht 5\' 1"  (1.549 m)   Wt 180 lb (81.6 kg)   LMP 06/01/2016 (Approximate)   SpO2 99%   BMI 34.01 kg/m  BP Readings from Last 3 Encounters:  12/31/23 136/82  09/30/23 122/88  06/28/23 130/80   Wt Readings from Last 3 Encounters:  12/31/23 180 lb (81.6 kg)  09/30/23 179 lb 12.8 oz (81.6 kg)  06/28/23 176 lb (79.8 kg)   SpO2 Readings from Last 3 Encounters:  12/31/23 99%  09/30/23 92%  06/28/23 96%      Physical Exam Vitals and nursing note reviewed.  Constitutional:      Appearance: Normal appearance.   Cardiovascular:     Rate and Rhythm: Normal rate and regular rhythm.     Heart sounds: Normal heart sounds.  Pulmonary:     Effort: Pulmonary effort is normal.     Breath sounds: Normal breath sounds.  Abdominal:     General: Bowel sounds are normal.  Neurological:     Mental Status: She is alert.      Results for orders placed or performed in visit on 12/31/23  POCT glycosylated hemoglobin (Hb A1C)  Result Value Ref Range   Hemoglobin A1C 9.8 (A) 4.0 - 5.6 %   HbA1c POC (<> result, manual entry)     HbA1c, POC (prediabetic range)     HbA1c, POC (controlled diabetic range)        The ASCVD Risk score (Arnett DK, et al., 2019) failed to calculate for the following reasons:   Cannot find a previous HDL lab   Cannot find a previous total cholesterol lab    Assessment & Plan:   Problem List Items Addressed This Visit       Endocrine   Uncontrolled type 2 diabetes mellitus with hyperglycemia (HCC) - Primary   Patient is A1c up today.  She is taking Lantus 35 units nightly and Ozempic 2 mg once weekly.  Will add on glipizide 5 mg XL.  Hypoglycemia  reviewed.  Patient was given a handout to go over the symptoms.  Continue checking sugars twice daily continue working healthy lifestyle modifications.      Relevant Medications   glipiZIDE (GLUCOTROL XL) 5 MG 24 hr tablet   Other Relevant Orders   POCT glycosylated hemoglobin (Hb A1C) (Completed)   AMB Referral [facility] Care Management    Return in about 3 months (around 03/30/2024) for DM recheck.    Audria Nine, NP

## 2023-12-31 NOTE — Assessment & Plan Note (Signed)
Patient is A1c up today.  She is taking Lantus 35 units nightly and Ozempic 2 mg once weekly.  Will add on glipizide 5 mg XL.  Hypoglycemia reviewed.  Patient was given a handout to go over the symptoms.  Continue checking sugars twice daily continue working healthy lifestyle modifications.

## 2023-12-31 NOTE — Patient Instructions (Signed)
Nice to see you today Continue checking your sugar levels two times a day  I have added on glipizide that you will take in the mornings Follow up with me in 3 months, sooner if you need me

## 2024-01-10 ENCOUNTER — Other Ambulatory Visit: Payer: Self-pay | Admitting: Nurse Practitioner

## 2024-01-10 DIAGNOSIS — E1165 Type 2 diabetes mellitus with hyperglycemia: Secondary | ICD-10-CM

## 2024-01-15 ENCOUNTER — Other Ambulatory Visit: Payer: Self-pay

## 2024-01-15 NOTE — Patient Instructions (Signed)
Visit Information  Stacy Dixon was given information about Medicaid Managed Care team care coordination services as a part of their [facility] Medicaid benefit. Stacy Dixon verbally consented to engagement with the [facility] Managed Care team.   If you are experiencing a medical emergency, please call 911 or report to your local emergency department or urgent care.   If you have a non-emergency medical problem during routine business hours, please contact your provider's office and ask to speak with a nurse.   For questions related to your [facility], please call: [phone] or visit the homepage here: [email or web address]  If you would like to schedule transportation through your [facility], please call the following number at least 2 days in advance of your appointment: [phone]   Rides for urgent appointments can also be made after hours by calling Member Services.  Call the Behavioral Health Crisis Line at [phone], at any time, 24 hours a day, 7 days a week. If you are in danger or need immediate medical attention call 911.  If you would like help to quit smoking, call 1-800-QUIT-NOW (435-426-3314) OR Espaol: 1-855-Djelo-Ya (4-332-951-8841) o para ms informacin haga clic aqu or Text READY to 660-630 to register via text  Stacy Dixon - following are the goals we discussed in your visit today:   Goals Addressed   None       Social Worker will follow up in 30 days.   Gus Puma, Kenard Gower, MHA [facility]  Managed Medicaid Social Worker [phone]   Following is a copy of your plan of care:  There are no care plans that you recently modified to display for this patient.

## 2024-01-15 NOTE — Patient Outreach (Signed)
  Medicaid Managed Care Social Work Note  01/15/2024 Name:  Stacy Dixon MRN:  [id number] DOB:  1960-12-21  Stacy Dixon is an 63 y.o. year old female who is a primary patient of Eden Emms, NP.  The Medicaid Managed Care Coordination team was consulted for assistance with:  Community Resources   Stacy Dixon was given information about Medicaid Managed Care Coordination team services today. Stacy Dixon Patient agreed to services and verbal consent obtained.  Engaged with patient  for by telephone forinitial visit in response to referral for case management and/or care coordination services.   Patient is participating in a Managed Medicaid Plan:  Yes  Assessments/Interventions:  Review of past medical history, allergies, medications, health status, including review of consultants reports, laboratory and other test data, was performed as part of comprehensive evaluation and provision of chronic care management services.  SDOH: (Social Drivers of Health) assessments and interventions performed:  BSW completed a telephone outreach with patient, she states she applied for foodstamps but was denied due to her daughter receiving foodstamps. Patient states she quit her job to be able to afford rent with her daughter. BSW and patient agreed for resources for food,rent and utilities to be emailed to email on file. No other resources are needed at this time. Advanced Directives Status:  Not addressed in this encounter.  Care Plan                 Allergies  Allergen Reactions   Prednisone Anaphylaxis    Medications Reviewed Today   Medications were not reviewed in this encounter     Patient Active Problem List   Diagnosis Date Noted   Encounter for screening colonoscopy 04/19/2023   Cecal polyp 04/19/2023   Uncontrolled type 2 diabetes mellitus with hyperglycemia (HCC) 03/29/2023   Hyperlipidemia associated with type 2 diabetes mellitus (HCC) 03/29/2023   Mild intermittent  asthma without complication 03/29/2023   Choledocholithiasis 03/07/2014   Cholecystitis, acute 03/07/2014   Choledocholithiasis with obstruction 03/06/2014   Transaminitis 03/06/2014   Cholelithiasis 03/06/2014   Hypertension associated with diabetes (HCC) 03/06/2014   Asthma with exacerbation 03/06/2014    Conditions to be addressed/monitored per PCP order:   community resources  There are no care plans that you recently modified to display for this patient.   Follow up:  Patient agrees to Care Plan and Follow-up.  Plan: The Managed Medicaid care management team will reach out to the patient again over the next 30 days.  Date/time of next scheduled Social Work care management/care coordination outreach:  02/12/24  Gus Puma, Kenard Gower, [facility]  Managed [facility] Social Worker [phone]

## 2024-02-06 ENCOUNTER — Other Ambulatory Visit: Payer: Self-pay | Admitting: Nurse Practitioner

## 2024-02-06 DIAGNOSIS — E1165 Type 2 diabetes mellitus with hyperglycemia: Secondary | ICD-10-CM

## 2024-02-07 ENCOUNTER — Other Ambulatory Visit: Payer: Self-pay | Admitting: Nurse Practitioner

## 2024-02-07 DIAGNOSIS — E1165 Type 2 diabetes mellitus with hyperglycemia: Secondary | ICD-10-CM

## 2024-02-07 NOTE — Telephone Encounter (Signed)
 Copied from CRM [id number]. Topic: Clinical - Medication Refill >> Feb 07, 2024 11:26 AM Dimitri Ped wrote: Most Recent Primary Care Visit:  Provider: Eden Emms  Department: [facility]  Visit Type: OFFICE VISIT  Date: 12/31/2023  Medication: Glucose Blood (BLOOD GLUCOSE TEST STRIPS) STRP   Has the patient contacted their pharmacy? Yes they said to contact doctor no more refills (Agent: If no, request that the patient contact the pharmacy for the refill. If patient does not wish to contact the pharmacy document the reason why and proceed with request.) (Agent: If yes, when and what did the pharmacy advise?)  Is this the correct pharmacy for this prescription? Yes If no, delete pharmacy and type the correct one.  This is the patient's preferred pharmacy:  [id number] [place], [place] - [address] [place] [address] Phone: [phone] Fax: [phone]   Has the prescription been filled recently? No  Is the patient out of the medication? No had a few left   Has the patient been seen for an appointment in the last year OR does the patient have an upcoming appointment? Yes  Can we respond through MyChart? Yes  Agent: Please be advised that Rx refills may take up to 3 business days. We ask that you follow-up with your pharmacy.

## 2024-02-11 ENCOUNTER — Other Ambulatory Visit: Payer: Self-pay | Admitting: Nurse Practitioner

## 2024-02-11 DIAGNOSIS — E1159 Type 2 diabetes mellitus with other circulatory complications: Secondary | ICD-10-CM

## 2024-02-12 ENCOUNTER — Other Ambulatory Visit: Payer: Self-pay

## 2024-02-12 NOTE — Patient Instructions (Signed)
  Medicaid Managed Care   Unsuccessful Outreach Note  02/12/2024 Name: Stacy Dixon MRN: [id number] DOB: 1961-03-18  Referred by: Eden Emms, NP Reason for referral : High Risk Managed Medicaid (MM social work unsuccessful telephone outreach )   An unsuccessful telephone outreach was attempted today. The patient was referred to the case management team for assistance with care management and care coordination.   Follow Up Plan: A HIPAA compliant phone message was left for the patient providing contact information and requesting a return call.   Abelino Derrick, MHA [facility]  Managed [facility] Social Worker [phone]

## 2024-02-12 NOTE — Patient Outreach (Signed)
  Medicaid Managed Care   Unsuccessful Outreach Note  02/12/2024 Name: Stacy Dixon MRN: [id number] DOB: 1961-03-18  Referred by: Eden Emms, NP Reason for referral : High Risk Managed Medicaid (MM social work unsuccessful telephone outreach )   An unsuccessful telephone outreach was attempted today. The patient was referred to the case management team for assistance with care management and care coordination.   Follow Up Plan: A HIPAA compliant phone message was left for the patient providing contact information and requesting a return call.   Abelino Derrick, MHA [facility]  Managed [facility] Social Worker [phone]

## 2024-02-20 ENCOUNTER — Other Ambulatory Visit: Payer: Self-pay | Admitting: Nurse Practitioner

## 2024-03-10 ENCOUNTER — Telehealth: Payer: Self-pay

## 2024-03-10 NOTE — Telephone Encounter (Signed)
 Pharmacy Patient Advocate Encounter   Received notification from CoverMyMeds that prior authorization for Ozempic (0.25 or 0.5 MG/DOSE) 2MG /3ML pen-injectors is required/requested.   Insurance verification completed.   The patient is insured through [facility] .   Per test claim: PA required; PA submitted to above mentioned insurance via CoverMyMeds Key/confirmation #/EOC [id number] Status is pending

## 2024-03-11 NOTE — Telephone Encounter (Signed)
 Pharmacy Patient Advocate Encounter  Received notification from [facility] that Prior Authorization for Ozempic (0.25 or 0.5 MG/DOSE) 2MG /3ML pen-injectors has been APPROVED from 03/10/2024 to 03/10/2025   PA #/Case ID/Reference #: [id number]

## 2024-03-31 ENCOUNTER — Ambulatory Visit: Payer: Medicaid Other | Admitting: Nurse Practitioner

## 2024-03-31 VITALS — BP 138/88 | HR 99 | Temp 98.1°F | Ht 61.0 in | Wt 180.4 lb

## 2024-03-31 DIAGNOSIS — E1165 Type 2 diabetes mellitus with hyperglycemia: Secondary | ICD-10-CM

## 2024-03-31 DIAGNOSIS — I152 Hypertension secondary to endocrine disorders: Secondary | ICD-10-CM | POA: Diagnosis not present

## 2024-03-31 DIAGNOSIS — Z7984 Long term (current) use of oral hypoglycemic drugs: Secondary | ICD-10-CM | POA: Diagnosis not present

## 2024-03-31 DIAGNOSIS — E1159 Type 2 diabetes mellitus with other circulatory complications: Secondary | ICD-10-CM

## 2024-03-31 LAB — CBC
HCT: 44.8 % (ref 36.0–46.0)
Hemoglobin: 14.4 g/dL (ref 12.0–15.0)
MCHC: 32 g/dL (ref 30.0–36.0)
MCV: 83.5 fl (ref 78.0–100.0)
Platelets: 264 10*3/uL (ref 150.0–400.0)
RBC: 5.37 Mil/uL — ABNORMAL HIGH (ref 3.87–5.11)
RDW: 13.8 % (ref 11.5–15.5)
WBC: 6.9 10*3/uL (ref 4.0–10.5)

## 2024-03-31 LAB — BASIC METABOLIC PANEL WITH GFR
BUN: 7 mg/dL (ref 6–23)
CO2: 28 meq/L (ref 19–32)
Calcium: 9.7 mg/dL (ref 8.4–10.5)
Chloride: 102 meq/L (ref 96–112)
Creatinine, Ser: 0.57 mg/dL (ref 0.40–1.20)
GFR: 97.34 mL/min (ref >60.00)
Glucose, Bld: 132 mg/dL — ABNORMAL HIGH (ref 70–99)
Potassium: 3.8 meq/L (ref 3.5–5.1)
Sodium: 138 meq/L (ref 135–145)

## 2024-03-31 LAB — POCT GLYCOSYLATED HEMOGLOBIN (HGB A1C): Hemoglobin A1C: 8.5 % — AB (ref 4.0–5.6)

## 2024-03-31 MED ORDER — GLIPIZIDE ER 10 MG PO TB24: 10.0000 mg | ORAL_TABLET | Freq: Every day | ORAL | 1 refills | Status: DC

## 2024-03-31 NOTE — Assessment & Plan Note (Signed)
 Patients blood pressure was elevated at first check but within normal limits on recheck. Continue lisinopril -hydrochlorothiazide  and amlodipine 

## 2024-03-31 NOTE — Assessment & Plan Note (Signed)
 Improvement in A1C. Not at goal. Increase glipizide  to 10mg  XL daily. Continue ozempic  2mg  weekly. Continue lantus  35 units daily. Continue checking sugar at home. No hypoglycemia reported

## 2024-03-31 NOTE — Progress Notes (Signed)
 Established Patient Office Visit  Subjective   Patient ID: Stacy Dixon, female    DOB: 04-02-1961  Age: 63 y.o. MRN: [id number]  Chief Complaint  Patient presents with   Diabetes       DM2: patietn is currently taking lantus  35 units, ozempic  2mg  weekly and glipizide  5mg  XL. She is here for a follow up. She is checking her BID and she has been getting 130 this morning and then 156 last night. She has not been getting low sugars States that her bowels have slowed done to daily vs several time s a day. State that she is not having painful or hard stools  HTN: states that she does have a bp cuff at home and she will check it at home and has been doing wel. States that she has been running around a lot and had to take her daughter and her niece to work.   Review of Systems  Constitutional:  Negative for chills and fever.  Respiratory:  Negative for shortness of breath.   Cardiovascular:  Negative for chest pain.  Gastrointestinal:  Positive for nausea. Negative for abdominal pain, constipation, diarrhea and vomiting.  Neurological:  Negative for headaches.      Objective:     BP 138/88   Pulse 99   Temp 98.1 F (36.7 C) (Oral)   Ht 5\' 1"  (1.549 m)   Wt 180 lb 6.4 oz (81.8 kg)   LMP 06/01/2016 (Approximate)   SpO2 100%   BMI 34.09 kg/m  BP Readings from Last 3 Encounters:  03/31/24 138/88  12/31/23 136/82  09/30/23 122/88   Wt Readings from Last 3 Encounters:  03/31/24 180 lb 6.4 oz (81.8 kg)  12/31/23 180 lb (81.6 kg)  09/30/23 179 lb 12.8 oz (81.6 kg)   SpO2 Readings from Last 3 Encounters:  03/31/24 100%  12/31/23 99%  09/30/23 92%      Physical Exam Vitals and nursing note reviewed.  Constitutional:      Appearance: Normal appearance.  Cardiovascular:     Rate and Rhythm: Normal rate and regular rhythm.     Heart sounds: Normal heart sounds.  Pulmonary:     Effort: Pulmonary effort is normal.     Breath sounds: Normal breath sounds.   Abdominal:     General: Bowel sounds are normal.  Neurological:     Mental Status: She is alert.      Results for orders placed or performed in visit on 03/31/24  POCT glycosylated hemoglobin (Hb A1C)  Result Value Ref Range   Hemoglobin A1C 8.5 (A) 4.0 - 5.6 %   HbA1c POC (<> result, manual entry)     HbA1c, POC (prediabetic range)     HbA1c, POC (controlled diabetic range)        The ASCVD Risk score (Arnett DK, et al., 2019) failed to calculate for the following reasons:   Cannot find a previous HDL lab   Cannot find a previous total cholesterol lab    Assessment & Plan:   Problem List Items Addressed This Visit       Cardiovascular and Mediastinum   Hypertension associated with diabetes (HCC)   Patients blood pressure was elevated at first check but within normal limits on recheck. Continue lisinopril -hydrochlorothiazide  and amlodipine        Relevant Medications   glipiZIDE  (GLUCOTROL  XL) 10 MG 24 hr tablet     Endocrine   Uncontrolled type 2 diabetes mellitus with hyperglycemia (HCC) - Primary  Improvement in A1C. Not at goal. Increase glipizide  to 10mg  XL daily. Continue ozempic  2mg  weekly. Continue lantus  35 units daily. Continue checking sugar at home. No hypoglycemia reported       Relevant Medications   glipiZIDE  (GLUCOTROL  XL) 10 MG 24 hr tablet   Other Relevant Orders   POCT glycosylated hemoglobin (Hb A1C) (Completed)   CBC   Basic metabolic panel with GFR    Return in about 3 months (around 06/30/2024) for DM recheck.    Stacy Shay, NP

## 2024-03-31 NOTE — Patient Instructions (Addendum)
 Nice to see you today I have increased the glipizide  to 10mg  a day.  Follow up with me in 3 months, sooner if you need me

## 2024-04-02 ENCOUNTER — Encounter: Payer: Self-pay | Admitting: Nurse Practitioner

## 2024-04-03 ENCOUNTER — Other Ambulatory Visit: Payer: Self-pay | Admitting: Nurse Practitioner

## 2024-04-10 ENCOUNTER — Ambulatory Visit
Admission: RE | Admit: 2024-04-10 | Discharge: 2024-04-10 | Disposition: A | Discharge status: Home / Self Care | Source: Ambulatory Visit | Attending: Emergency Medicine | Admitting: Emergency Medicine

## 2024-04-10 VITALS — BP 158/96 | HR 108 | Temp 97.4°F | Resp 16

## 2024-04-10 DIAGNOSIS — R350 Frequency of micturition: Secondary | ICD-10-CM | POA: Diagnosis not present

## 2024-04-10 DIAGNOSIS — N898 Other specified noninflammatory disorders of vagina: Secondary | ICD-10-CM | POA: Diagnosis not present

## 2024-04-10 LAB — POCT URINALYSIS DIP (MANUAL ENTRY)
Bilirubin, UA: NEGATIVE
Blood, UA: NEGATIVE
Glucose, UA: NEGATIVE mg/dL
Leukocytes, UA: NEGATIVE
Nitrite, UA: NEGATIVE
Protein Ur, POC: 30 mg/dL — AB
Spec Grav, UA: 1.025
Urobilinogen, UA: 0.2 U/dL
pH, UA: 5.5

## 2024-04-10 MED ORDER — METRONIDAZOLE 0.75 % VA GEL: 1.0000 | Freq: Every day | VAGINAL | 0 refills | Status: DC

## 2024-04-10 NOTE — ED Triage Notes (Signed)
 Patient presents to UC for vaginal discharge, urinary freq x 2 weeks. States pcp increased her Semaglutide  and she developed these symptoms. Treated symptoms with OTC miconazole.

## 2024-04-10 NOTE — ED Provider Notes (Signed)
 Stacy Dixon    CSN: [id number] Arrival date & time: 04/10/24  1101      History   Chief Complaint Chief Complaint  Patient presents with   Vaginal Discharge    Yeast infection - Entered by patient    HPI Stacy Dixon is a 63 y.o. female.   Patient presents for evaluation of a thin clear vaginal discharge, urinary frequency and left lower back pain present for 2 weeks.  Endorses a cut on the left labia causing irritation.  Has attempted use of over-the-counter miconazole which has been ineffective.  Endorses symptoms began after increasing dosages of diabetes medicines.  Denies hematuria, dysuria, abdominal pain, fever, vaginal itching or odor.  Past Medical History:  Diagnosis Date   Anemia    Asthma    Diabetes mellitus without complication (HCC)    GERD (gastroesophageal reflux disease)    Heart murmur    Some question of a history of A.Fib, but on no control meds and patient is in NSR on ED EKG   Hypertension     Patient Active Problem List   Diagnosis Date Noted   Encounter for screening colonoscopy 04/19/2023   Cecal polyp 04/19/2023   Uncontrolled type 2 diabetes mellitus with hyperglycemia (HCC) 03/29/2023   Hyperlipidemia associated with type 2 diabetes mellitus (HCC) 03/29/2023   Mild intermittent asthma without complication 03/29/2023   Choledocholithiasis 03/07/2014   Cholecystitis, acute 03/07/2014   Choledocholithiasis with obstruction 03/06/2014   Transaminitis 03/06/2014   Cholelithiasis 03/06/2014   Hypertension associated with diabetes (HCC) 03/06/2014   Asthma with exacerbation 03/06/2014    Past Surgical History:  Procedure Laterality Date   CESAREAN SECTION     CHOLECYSTECTOMY N/A 03/07/2014   Procedure: LAPAROSCOPIC CHOLECYSTECTOMY ;  Surgeon: Cloyce Darby, MD;  Location: [facility] OR;  Service: General;  Laterality: N/A;   COLONOSCOPY WITH PROPOFOL  N/A 04/19/2023   Procedure: COLONOSCOPY WITH PROPOFOL ;  Surgeon: Selena Daily, MD;  Location: [facility] ENDOSCOPY;  Service: Gastroenterology;  Laterality: N/A;   ERCP N/A 03/07/2014   Procedure: ENDOSCOPIC RETROGRADE CHOLANGIOPANCREATOGRAPHY (ERCP);  Surgeon: Janel Medford, MD;  Location: [facility] OR;  Service: Endoscopy;  Laterality: N/A;  we are planning to do this at same time as cholecystectomy.    TUBAL LIGATION  12/03/1993   UTERINE FIBROID SURGERY  12/04/2011    OB History   No obstetric history on file.      Home Medications    Prior to Admission medications   Medication Sig Start Date End Date Taking? Authorizing Provider  metroNIDAZOLE  (METROGEL ) 0.75 % vaginal gel Place 1 Applicatorful vaginally at bedtime. 04/10/24  Yes Uriah Philipson R, NP  ACCU-CHEK GUIDE TEST test strip 1 EACH BY IN VITRO ROUTE 2 (TWO) TIMES DAILY. MAY SUBSTITUTE TO ANY MANUFACTURER COVERED BY PATIENT'S INSURANCE. HAS A TRUPLUS METER AT HOME 02/08/24   Dorothe Gaster, NP  Accu-Chek Softclix Lancets lancets PLEASE SEE ATTACHED FOR DETAILED DIRECTIONS 04/21/23   [provider]  amLODipine  (NORVASC ) 10 MG tablet TAKE 1 TABLET BY MOUTH EVERY DAY 02/13/24   Dorothe Gaster, NP  ASPIRIN  LOW DOSE 81 MG tablet TAKE 1 TABLET BY MOUTH EVERY DAY 02/13/24   Dorothe Gaster, NP  atorvastatin  (LIPITOR) 40 MG tablet Take 1 tablet (40 mg total) by mouth daily. 03/29/23   Dorothe Gaster, NP  Blood Glucose Monitoring Suppl DEVI 1 each by Does not apply route in the morning, at noon, and at bedtime. May substitute to any  manufacturer covered by [employer]. 03/29/23   Dorothe Gaster, NP  cetirizine  (ZYRTEC ) 10 MG tablet TAKE 1 TABLET BY MOUTH EVERY DAY 04/03/24   Dorothe Gaster, NP  fluconazole  (DIFLUCAN ) 150 MG tablet Take by mouth. 02/07/23   [provider]  glipiZIDE  (GLUCOTROL  XL) 10 MG 24 hr tablet Take 1 tablet (10 mg total) by mouth daily with breakfast. 03/31/24   Dorothe Gaster, NP  insulin  glargine (LANTUS  SOLOSTAR) 100 UNIT/ML Solostar Pen INJECT 35 UNITS INTO THE SKIN DAILY.  02/20/24   Dorothe Gaster, NP  Insulin  Pen Needle (PEN NEEDLES) 33G X 4 MM MISC 1 each by Does not apply route daily. 06/28/23   Dorothe Gaster, NP  IRON PO Take 1 tablet by mouth daily.    [provider]  Lancets Misc. MISC 1 each by Does not apply route in the morning and at bedtime. May substitute to any manufacturer covered by patient's insurance. 03/29/23   Dorothe Gaster, NP  lisinopril -hydrochlorothiazide  (ZESTORETIC ) 20-12.5 MG tablet TAKE 1 TABLET BY MOUTH EVERY DAY 02/13/24   Dorothe Gaster, NP  meclizine  (ANTIVERT ) 25 MG tablet Take 1 tablet (25 mg total) by mouth 3 (three) times daily as needed for dizziness. 01/30/23   Arline Bennett, MD  montelukast  (SINGULAIR ) 10 MG tablet Take 10 mg by mouth at bedtime.    [provider]  Semaglutide , 2 MG/DOSE, 8 MG/3ML SOPN Inject 2 mg as directed once a week. 09/30/23   Dorothe Gaster, NP  VENTOLIN  HFA 108 (90 Base) MCG/ACT inhaler INHALE 2 PUFFS BY MOUTH EVERY 6 HOURS AS NEEDED FOR WHEEZE OR SHORTNESS OF BREATH 06/10/23   Dorothe Gaster, NP    Family History Family History  Problem Relation Age of Onset   Lung cancer Mother    Lung cancer Father    Breast cancer Cousin 53    Social History Social History   Tobacco Use   Smoking status: Never   Smokeless tobacco: Never  Vaping Use   Vaping status: Never Used  Substance Use Topics   Alcohol use: Yes    Comment: socially   Drug use: No     Allergies   Prednisone   Review of Systems Review of Systems   Physical Exam Triage Vital Signs ED Triage Vitals [04/10/24 1126]  Encounter Vitals Group     BP (!) 158/96     Systolic BP Percentile      Diastolic BP Percentile      Pulse Rate (!) 108     Resp 16     Temp (!) 97.4 F (36.3 C)     Temp Source Temporal     SpO2 97 %     Weight      Height      Head Circumference      Peak Flow      Pain Score 0     Pain Loc      Pain Education      Exclude from Growth Chart    No data found.  Updated Vital  Signs BP (!) 158/96 (BP Location: Left Arm)   Pulse (!) 108   Temp (!) 97.4 F (36.3 C) (Temporal)   Resp 16   LMP 06/01/2016 (Approximate)   SpO2 97%   Visual Acuity Right Eye Distance:   Left Eye Distance:   Bilateral Distance:    Right Eye Near:   Left Eye Near:    Bilateral Near:  Physical Exam Constitutional:      Appearance: Normal appearance.  Eyes:     Extraocular Movements: Extraocular movements intact.  Pulmonary:     Effort: Pulmonary effort is normal.  Abdominal:     Tenderness: There is no abdominal tenderness. There is no right CVA tenderness, left CVA tenderness or guarding.  Genitourinary:    Comments: deferred Neurological:     Mental Status: She is alert and oriented to person, place, and time.      UC Treatments / Results  Labs (all labs ordered are listed, but only abnormal results are displayed) Labs Reviewed  POCT URINALYSIS DIP (MANUAL ENTRY) - Abnormal; Notable for the following components:      Result Value   Clarity, UA cloudy (*)    Ketones, POC UA trace (5) (*)    Protein Ur, POC =30 (*)    All other components within normal limits  URINE CULTURE  CERVICOVAGINAL ANCILLARY ONLY    EKG   Radiology No results found.  Procedures Procedures (including critical care time)  Medications Ordered in UC Medications - No data to display  Initial Impression / Assessment and Plan / UC Course  I have reviewed the triage vital signs and the nursing notes.  Pertinent labs & imaging results that were available during my care of the patient were reviewed by me and considered in my medical decision making (see chart for details).  Vaginal discharge, urinary frequency  Urinalysis negative, sent for culture, discussed findings, vaginal swab checking for yeast and BV pending, declined full STI panel, denies sexual activity at this time, has been taking miconazole without improvement therefore discontinue and initiating MetroGel , will add  additional treatment based on test results, recommended nonpharmacological supportive measures with follow-up with urgent care as needed Final Clinical Impressions(s) / UC Diagnoses   Final diagnoses:  Vaginal discharge  Urinary frequency   Discharge Instructions      Today you are being treated prophylactically for  Bacterial vaginosis   Urinalysis is negative for bladder infection  Vaginal swab checking for yeast and BV is pending for 2 to 3 days, you will be notified of positive test results only  Use MetroGel  every night before 7 days 7 days, do not drink alcohol while using medication, this will make you feel sick   May stop over-the-counter miconazole cream  Bacterial vaginosis which results from an overgrowth of one on several organisms that are normally present in your vagina. Vaginosis is an inflammation of the vagina that can result in discharge, itching and pain.  In addition: Avoid baths, hot tubs and whirlpool spas.  Don't use scented or harsh soaps Avoid irritants. These include scented tampons and pads. Wipe from front to back after using the toilet. Don't douche. Your vagina doesn't require cleansing other than normal bathing.  Use a condom.  Wear cotton underwear, this fabric absorbs some moisture.       ED Prescriptions     Medication Sig Dispense Auth. Provider   metroNIDAZOLE  (METROGEL ) 0.75 % vaginal gel Place 1 Applicatorful vaginally at bedtime. 70 g Reena Canning, NP      PDMP not reviewed this encounter.   Reena Canning, NP 04/10/24 1149

## 2024-04-10 NOTE — Discharge Instructions (Addendum)
 Today you are being treated prophylactically for  Bacterial vaginosis   Urinalysis is negative for bladder infection  Vaginal swab checking for yeast and BV is pending for 2 to 3 days, you will be notified of positive test results only  Use MetroGel  every night before 7 days 7 days, do not drink alcohol while using medication, this will make you feel sick   May stop over-the-counter miconazole cream  Bacterial vaginosis which results from an overgrowth of one on several organisms that are normally present in your vagina. Vaginosis is an inflammation of the vagina that can result in discharge, itching and pain.  In addition: Avoid baths, hot tubs and whirlpool spas.  Don't use scented or harsh soaps Avoid irritants. These include scented tampons and pads. Wipe from front to back after using the toilet. Don't douche. Your vagina doesn't require cleansing other than normal bathing.  Use a condom.  Wear cotton underwear, this fabric absorbs some moisture.

## 2024-04-11 LAB — URINE CULTURE: Culture: 10000 — AB

## 2024-04-13 LAB — CERVICOVAGINAL ANCILLARY ONLY
Bacterial Vaginitis (gardnerella): POSITIVE — AB
Candida Glabrata: NEGATIVE
Candida Vaginitis: NEGATIVE
Comment: NEGATIVE
Comment: NEGATIVE
Comment: NEGATIVE

## 2024-06-15 ENCOUNTER — Telehealth: Payer: Self-pay

## 2024-06-15 NOTE — Telephone Encounter (Signed)
 Copied from CRM [id number]. Topic: Appointments - Transfer of Care >> Jun 15, 2024  1:11 PM Tobias L wrote: Pt is requesting to transfer FROM: Lynwood Crandall, NP Pt is requesting to transfer TO: Michelene Cower, PA-C Reason for requested transfer: Patient wanting to establish care elsewhere.  It is the responsibility of the team the patient would like to transfer to Claudene Cower, PA-C) to reach out to the patient if for any reason this transfer is not acceptable.

## 2024-07-02 ENCOUNTER — Ambulatory Visit: Admitting: Nurse Practitioner

## 2024-07-03 ENCOUNTER — Other Ambulatory Visit: Payer: Self-pay | Admitting: Nurse Practitioner

## 2024-07-03 DIAGNOSIS — E1165 Type 2 diabetes mellitus with hyperglycemia: Secondary | ICD-10-CM

## 2024-07-14 DIAGNOSIS — M5442 Lumbago with sciatica, left side: Secondary | ICD-10-CM | POA: Diagnosis not present

## 2024-07-20 LAB — HM DIABETES EYE EXAM

## 2024-07-22 ENCOUNTER — Encounter: Payer: Self-pay | Admitting: Family Medicine

## 2024-07-22 ENCOUNTER — Telehealth: Payer: Self-pay | Admitting: Pharmacy Technician

## 2024-07-22 ENCOUNTER — Ambulatory Visit: Admitting: Family Medicine

## 2024-07-22 ENCOUNTER — Other Ambulatory Visit (HOSPITAL_COMMUNITY): Payer: Self-pay

## 2024-07-22 VITALS — BP 132/86 | HR 98 | Temp 97.9°F | Ht 62.5 in | Wt 180.0 lb

## 2024-07-22 DIAGNOSIS — J45901 Unspecified asthma with (acute) exacerbation: Secondary | ICD-10-CM

## 2024-07-22 DIAGNOSIS — M5442 Lumbago with sciatica, left side: Secondary | ICD-10-CM | POA: Diagnosis not present

## 2024-07-22 DIAGNOSIS — E1169 Type 2 diabetes mellitus with other specified complication: Secondary | ICD-10-CM

## 2024-07-22 DIAGNOSIS — Z1159 Encounter for screening for other viral diseases: Secondary | ICD-10-CM | POA: Diagnosis not present

## 2024-07-22 DIAGNOSIS — Z794 Long term (current) use of insulin: Secondary | ICD-10-CM | POA: Diagnosis not present

## 2024-07-22 DIAGNOSIS — E66811 Obesity, class 1: Secondary | ICD-10-CM | POA: Diagnosis not present

## 2024-07-22 DIAGNOSIS — E785 Hyperlipidemia, unspecified: Secondary | ICD-10-CM

## 2024-07-22 DIAGNOSIS — Z0000 Encounter for general adult medical examination without abnormal findings: Secondary | ICD-10-CM | POA: Diagnosis not present

## 2024-07-22 DIAGNOSIS — I152 Hypertension secondary to endocrine disorders: Secondary | ICD-10-CM | POA: Diagnosis not present

## 2024-07-22 DIAGNOSIS — E1165 Type 2 diabetes mellitus with hyperglycemia: Secondary | ICD-10-CM

## 2024-07-22 DIAGNOSIS — E1159 Type 2 diabetes mellitus with other circulatory complications: Secondary | ICD-10-CM | POA: Diagnosis not present

## 2024-07-22 DIAGNOSIS — E119 Type 2 diabetes mellitus without complications: Secondary | ICD-10-CM

## 2024-07-22 DIAGNOSIS — Z6832 Body mass index (BMI) 32.0-32.9, adult: Secondary | ICD-10-CM | POA: Diagnosis not present

## 2024-07-22 MED ORDER — KETOROLAC TROMETHAMINE 60 MG/2ML IM SOLN
60.0000 mg | Freq: Once | INTRAMUSCULAR | Status: AC
  Administered 2024-07-22: 60 mg via INTRAMUSCULAR

## 2024-07-22 MED ORDER — DEXAMETHASONE SODIUM PHOSPHATE 10 MG/ML IJ SOLN
10.0000 mg | Freq: Once | INTRAMUSCULAR | Status: AC
  Administered 2024-07-22: 10 mg via INTRAMUSCULAR

## 2024-07-22 MED ORDER — FREESTYLE LIBRE 3 PLUS SENSOR MISC: 1 refills | Status: AC

## 2024-07-22 MED ORDER — MELOXICAM 15 MG PO TABS
15.0000 mg | ORAL_TABLET | Freq: Every day | ORAL | 1 refills | Status: DC | PRN
Start: 2024-07-22 — End: 2024-09-10

## 2024-07-22 MED ORDER — KETOROLAC TROMETHAMINE 30 MG/ML IJ SOLN: 30.0000 mg | Freq: Once | INTRAMUSCULAR | Status: DC

## 2024-07-22 MED ORDER — CYCLOBENZAPRINE HCL 5 MG PO TABS: 5.0000 mg | ORAL_TABLET | Freq: Three times a day (TID) | ORAL | 0 refills | Status: DC | PRN

## 2024-07-22 MED ORDER — FREESTYLE LIBRE 3 READER DEVI: 0 refills | Status: AC

## 2024-07-22 NOTE — Assessment & Plan Note (Signed)
 Newer dx improving labs over the past 1-2 years but not yet at goal Currently sounds like only on ozempic  and insulin , GI SE and intolerance with metformin and glipizide    Lab Results  Component Value Date   HGBA1C 8.5 (A) 03/31/2024   HGBA1C 9.8 (A) 12/31/2023   HGBA1C 9.4 (A) 09/30/2023   HGBA1C 11.5 (A) 06/28/2023   HGBA1C 13.3 (A) 03/29/2023  Uncontrolled type 2 diabetes mellitus with recent increase in blood glucose levels. Metformin and glyburide not tolerated. Discussed insulin  dose adjustment and use of Libre monitor. - Check hemoglobin A1c. - Continue current insulin  regimen. - Continue Ozempic  at 2 mg weekly. - Discuss potential use of Libre monitor for glucose monitoring. - Schedule follow-up to discuss diabetes management.

## 2024-07-22 NOTE — Telephone Encounter (Signed)
 Pharmacy Patient Advocate Encounter  Received notification from [facility] MEDICAID that Prior Authorization for FreeStyle Libre 3 Plus Sensor and Receiver has been APPROVED from 07/22/24 to 01/22/2025. Ran test claim, Copay is $0.00. This test claim was processed through [facility]- copay amounts may vary at other pharmacies due to pharmacy/plan contracts, or as the patient moves through the different stages of their insurance plan.   PA #/Case ID/Reference #: [id number]

## 2024-07-22 NOTE — Patient Instructions (Signed)
 Go across the street to outpatient imaging today for the xray and you should hear from ortho and PT in the next couple days.   At this time I can clear you to work but I would have to write restrictions about no bending or lifting.  Take the mobic  daily and tylenol  over the counter a few times a day (as directed on bottle or box) Do heat therapy You can use the muscle relaxer - esp try a higher dose at bedtime, but you cannot take flexeril  and work or drive.  I will check your blood sugar/basic labs and we will need to do a follow up on adjusting your meds based on labs and sugar trends.  See if you can get the continuous glucose monitor sensors and supplies at your pharmacy.  They should be covered

## 2024-07-22 NOTE — Progress Notes (Signed)
 Name: Stacy Dixon   MRN: [id number]    DOB: 06-08-1961   Date:07/22/2024       Progress Note  Chief Complaint  Patient presents with   Establish Care    Patient presents to establish care with new pcp.  Request sent for DM eye exam report to [facility] Reports that blood pressure was elevated at home today, is elevated today as well. Patient was told by UC provider to stop lisinopril  while taking meloxicam / flexeril . Patient would like to discuss having lisinopril  and hydrochlorothiazide  in separate med.    Sciatica    Left sided sciatica pain, patient was recently given meloxicam  and flexeril  at urgent care but neither are helping. Onset x 2 weeks    Blood Sugar Problem    Patient reports reading of 230 this onring and high reading recently. States she has been taking medications and insulin  as prescribed- is not sure why blood glucose is reading so high.      Subjective:   Stacy Dixon is a 63 y.o. female, presents to clinic to est care for other [facility] and PCP  Hx of IDDM uncontrolled but labs over the past year were improving towards better glycemic control  Lab Results  Component Value Date   HGBA1C 8.5 (A) 03/31/2024   HGBA1C 9.8 (A) 12/31/2023   HGBA1C 9.4 (A) 09/30/2023   HGBA1C 11.5 (A) 06/28/2023   HGBA1C 13.3 (A) 03/29/2023   No past lipid panels found, pt on statin and lisinopril  No results found for: CHOL, HDL, LDLCALC, LDLDIRECT, TRIG, CHOLHDL   Low back pain that radiates to left leg all the way down the leg, onset 2 weeks ago not improvement with UC tx and resting  Pain worse with movement and trouble sleeping, sitting does not increase pain Similar episode maybe 10 years ago   Discussed the use of AI scribe software for clinical note transcription with the patient, who gave verbal consent to proceed.  History of Present Illness Stacy Dixon is a 63 year old female with diabetes who presents with worsening low back  pain and sciatica.  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 - Work involves physically demanding tasks, including lifting heavy items, which may contribute to symptoms - Frustration due to limitations imposed by pain  Diabetes mellitus management - Managed with 35 units of long-acting insulin  at night and Ozempic  2 mg weekly - Significant weight loss of over 100 pounds - No recent episodes of hypoglycemia  Medication intolerance and precautions - Cautious about medications that may affect kidney function due to family history of kidney disease - Previous adverse reaction to prednisone, resulting in facial swelling and blurred vision       Current Outpatient Medications:    ACCU-CHEK GUIDE TEST test strip, 1 EACH BY IN VITRO ROUTE 2 (TWO) TIMES DAILY. MAY SUBSTITUTE TO ANY MANUFACTURER COVERED BY PATIENT'S INSURANCE. HAS A TRUPLUS METER AT HOME, Disp: 100 strip, Rfl: 5  Accu-Chek Softclix Lancets lancets, PLEASE SEE ATTACHED FOR DETAILED DIRECTIONS, Disp: , Rfl:    amLODipine  (NORVASC ) 10 MG tablet, TAKE 1 TABLET BY MOUTH EVERY DAY, Disp: 90 tablet, Rfl: 1   ASPIRIN  LOW DOSE 81 MG tablet, TAKE 1 TABLET BY MOUTH EVERY DAY, Disp: 90 tablet, Rfl: 1   BD PEN NEEDLE NANO 2ND GEN 32G X 4 MM MISC, USE AS DIRECTED, Disp: 100 each, Rfl: 1   Blood Glucose Monitoring Suppl DEVI, 1 each by Does not apply route in the morning, at noon, and at bedtime.  May substitute to any manufacturer covered by patient's insurance., Disp: 1 each, Rfl: 0   cetirizine  (ZYRTEC ) 10 MG tablet, TAKE 1 TABLET BY MOUTH EVERY DAY, Disp: 30 tablet, Rfl: 11   cyclobenzaprine  (FLEXERIL ) 5 MG tablet, Take 5 mg by mouth 3 (three) times daily as needed., Disp: , Rfl:    glipiZIDE  (GLUCOTROL  XL) 10 MG 24 hr tablet, Take 1 tablet (10 mg total) by mouth daily with breakfast., Disp: 90 tablet, Rfl: 1   insulin  glargine (LANTUS  SOLOSTAR) 100 UNIT/ML Solostar Pen, INJECT 35 UNITS INTO THE SKIN DAILY., Disp: 45 mL, Rfl: 3   Lancets Misc. MISC, 1 each by Does not apply route in the morning and at bedtime. May substitute to any manufacturer covered by patient's insurance., Disp: 100 each, Rfl: 5   lisinopril -hydrochlorothiazide  (ZESTORETIC ) 20-12.5 MG tablet, TAKE 1 TABLET BY MOUTH EVERY DAY, Disp: 90 tablet, Rfl: 1   meclizine  (ANTIVERT ) 25 MG tablet, Take 1 tablet (25 mg total) by mouth 3 (three) times daily as needed for dizziness., Disp: 30 tablet, Rfl: 0   meloxicam  (MOBIC ) 7.5 MG tablet, Take 7.5 mg by mouth daily., Disp: , Rfl:    Multiple Vitamins-Minerals (MULTIVITAMIN WITH MINERALS) tablet, Take 1 tablet by mouth daily., Disp: , Rfl:    Semaglutide , 2 MG/DOSE, 8 MG/3ML SOPN, Inject 2 mg as directed once a week., Disp: 9 mL, Rfl: 1   VENTOLIN  HFA 108 (90 Base) MCG/ACT inhaler, INHALE 2 PUFFS BY MOUTH EVERY 6 HOURS AS NEEDED FOR WHEEZE OR SHORTNESS OF BREATH, Disp: 18 each, Rfl: 1  Patient Active Problem List   Diagnosis Date Noted   Encounter for screening colonoscopy 04/19/2023   Cecal polyp 04/19/2023   Uncontrolled type 2 diabetes mellitus with hyperglycemia (HCC) 03/29/2023   Hyperlipidemia associated with type 2 diabetes mellitus (HCC) 03/29/2023   Mild intermittent asthma without complication 03/29/2023   Choledocholithiasis 03/07/2014   Cholecystitis, acute 03/07/2014   Choledocholithiasis with obstruction 03/06/2014   Transaminitis 03/06/2014   Cholelithiasis  03/06/2014   Hypertension associated with diabetes (HCC) 03/06/2014   Asthma with exacerbation 03/06/2014    Past Surgical History:  Procedure Laterality Date   CESAREAN SECTION     CHOLECYSTECTOMY N/A 03/07/2014   Procedure: LAPAROSCOPIC CHOLECYSTECTOMY ;  Surgeon: Dann FORBES Hummer, MD;  Location: [facility] OR;  Service: General;  Laterality: N/A;   COLONOSCOPY WITH PROPOFOL  N/A 04/19/2023   Procedure: COLONOSCOPY WITH PROPOFOL ;  Surgeon: Unk Corinn Skiff, MD;  Location: [facility] ENDOSCOPY;  Service: Gastroenterology;  Laterality: N/A;   ERCP N/A 03/07/2014   Procedure: ENDOSCOPIC RETROGRADE CHOLANGIOPANCREATOGRAPHY (ERCP);  Surgeon: Toribio SHAUNNA Cedar, MD;  Location: [facility] OR;  Service: Endoscopy;  Laterality: N/A;  we are planning to do this at same time as cholecystectomy.    TUBAL LIGATION  12/03/1993   UTERINE FIBROID SURGERY  12/04/2011    Family History  Problem Relation Age of Onset   Lung cancer Mother  Alcohol abuse Mother    Arthritis Mother    COPD Mother    Hypertension Mother    Lung cancer Father    Asthma Father    Drug abuse Father    Heart disease Father    Cancer Maternal Grandmother    Breast cancer Cousin 23   Diabetes Sister     Social History   Tobacco Use   Smoking status: Never   Smokeless tobacco: Never  Vaping Use   Vaping status: Never Used  Substance Use Topics   Alcohol use: Yes    Comment: socially   Drug use: No     Allergies  Allergen Reactions   Prednisone Anaphylaxis    Health Maintenance  Topic Date Due   OPHTHALMOLOGY EXAM  Never done   HIV Screening  Never done   Diabetic kidney evaluation - Urine ACR  Never done   Cervical Cancer Screening (HPV/Pap Cotest)  08/01/2021   FOOT EXAM  03/28/2024   Zoster Vaccines- Shingrix (2 of 2) 10/21/2024 (Originally 08/03/2023)   COVID-19 Vaccine (3 - 2024-25 season) 12/30/2024 (Originally 08/04/2023)   INFLUENZA VACCINE  03/02/2025 (Originally 07/03/2024)   DTaP/Tdap/Td (1 - Tdap) 07/21/2025  (Originally 09/26/1980)   HEMOGLOBIN A1C  09/30/2024   Diabetic kidney evaluation - eGFR measurement  03/31/2025   MAMMOGRAM  05/16/2025   Colonoscopy  04/18/2033   Pneumococcal Vaccine: 50+ Years  Completed   Hepatitis C Screening  Completed   Hepatitis B Vaccines 19-59 Average Risk  Aged Out   HPV VACCINES  Aged Out   Meningococcal B Vaccine  Aged Out    Chart Review Today: I personally reviewed active problem list, medication list, allergies, family history, social history, health maintenance, notes from last encounter, lab results, imaging with the patient/caregiver today.   Review of Systems  Constitutional: Negative.   HENT: Negative.    Eyes: Negative.   Respiratory: Negative.    Cardiovascular: Negative.   Gastrointestinal: Negative.   Endocrine: Negative.   Genitourinary: Negative.   Musculoskeletal: Negative.   Skin: Negative.   Allergic/Immunologic: Negative.   Neurological: Negative.   Hematological: Negative.   Psychiatric/Behavioral: Negative.    All other systems reviewed and are negative.    Objective:   Vitals:   07/22/24 1010 07/22/24 1043  BP: (!) 142/80 132/86  Pulse: 98   Temp: 97.9 F (36.6 C)   TempSrc: Oral   SpO2: 98%   Weight: 180 lb (81.6 kg)   Height: 5' 2.5 (1.588 m)     Body mass index is 32.4 kg/m.  Physical Exam Vitals and nursing note reviewed.  Constitutional:      General: She is not in acute distress.    Appearance: Normal appearance. She is well-developed. She is obese. She is not ill-appearing, toxic-appearing or diaphoretic.  HENT:     Head: Normocephalic and atraumatic.     Right Ear: External ear normal.     Left Ear: External ear normal.     Nose: Nose normal.  Eyes:     General: No scleral icterus.       Right eye: No discharge.        Left eye: No discharge.     Conjunctiva/sclera: Conjunctivae normal.  Neck:     Trachea: No tracheal deviation.  Cardiovascular:     Rate and Rhythm: Normal rate.   Pulmonary:     Effort: Pulmonary effort is normal. No respiratory distress.     Breath sounds: No stridor.  Abdominal:  General: There is no distension.     Palpations: Abdomen is soft.  Musculoskeletal:     Left hip: Normal. No tenderness or bony tenderness. Normal range of motion.     Comments: No midline tenderness from cervical to lumbar spine, no step off No paraspinal muscle ttp from cervical to thoracic spine. Msk ttp to left lower lumbar paraspinal muscles to left SI joint area Grossly normal ROM of back 5/5 strength bilaterally with dorsiflexion, plantarflexion, flexion and extension at knees, and flexion and extension at hips Grossly normal sensation to light touch to bilateral lower extremities antalgic gait    Skin:    General: Skin is warm and dry.     Findings: No rash.  Neurological:     General: No focal deficit present.     Mental Status: She is alert.     Motor: No abnormal muscle tone.     Coordination: Coordination normal.     Gait: Gait normal.  Psychiatric:        Mood and Affect: Mood normal.        Behavior: Behavior normal.      Functional Status Survey: Is the patient deaf or have difficulty hearing?: No Does the patient have difficulty seeing, even when wearing glasses/contacts?: No Does the patient have difficulty concentrating, remembering, or making decisions?: No Does the patient have difficulty walking or climbing stairs?: Yes (Patient is currently having sciatica pain) Does the patient have difficulty dressing or bathing?: No Does the patient have difficulty doing errands alone such as visiting a doctor's office or shopping?: No Results for orders placed or performed during the hospital encounter of 04/10/24  POCT urinalysis dipstick   Collection Time: 04/10/24 11:29 AM  Result Value Ref Range   Color, UA yellow    Clarity, UA cloudy (A)    Glucose, UA negative mg/dL   Bilirubin, UA negative    Ketones, POC UA trace (5) (A) mg/dL    Spec Grav, UA 8.974    Blood, UA negative    pH, UA 5.5    Protein Ur, POC =30 (A) mg/dL   Urobilinogen, UA 0.2 E.U./dL   Nitrite, UA Negative    Leukocytes, UA Negative   Cervicovaginal ancillary only   Collection Time: 04/10/24 11:29 AM  Result Value Ref Range   Bacterial Vaginitis (gardnerella) Positive (A)    Candida Vaginitis Negative    Candida Glabrata Negative    Comment      Normal Reference Range Bacterial Vaginosis - Negative   Comment Normal Reference Range Candida Species - Negative    Comment Normal Reference Range Candida Galbrata - Negative   Urine Culture   Collection Time: 04/10/24 11:35 AM   Specimen: Urine, Clean Catch  Result Value Ref Range   Specimen Description URINE, CLEAN CATCH    Special Requests NONE    Culture (A)     10,000 COLONIES/mL GROUP B STREP(S.AGALACTIAE)ISOLATED TESTING AGAINST S. AGALACTIAE NOT ROUTINELY PERFORMED DUE TO PREDICTABILITY OF AMP/PEN/VAN SUSCEPTIBILITY. WITHIN MIXED CULTURE Performed at [facility], [address] [place], [place] [address]    Report Status 04/11/2024 FINAL       Assessment & Plan:     Assessment & Plan  Problem List Items Addressed This Visit     Hypertension associated with diabetes (HCC)   BP near goal but is currently holding lisinopril -HCTZ per UC recommendations and is taking norvasc  only Advised to resume both HTN meds and we will monitor with closer f/up OV  BP Readings from Last 3 Encounters:  07/22/24 132/86  04/10/24 (!) 158/96  03/31/24 138/88         Relevant Orders   Comprehensive Metabolic Panel (CMET)   Asthma with exacerbation   Not discussed at length today      Relevant Orders   CBC with Differential/Platelet   Uncontrolled type 2 diabetes mellitus with hyperglycemia (HCC)   Newer dx improving labs over the past 1-2 years but not yet at goal Currently sounds like only on ozempic  and insulin , GI SE and intolerance with metformin and glipizide    Lab Results   Component Value Date   HGBA1C 8.5 (A) 03/31/2024   HGBA1C 9.8 (A) 12/31/2023   HGBA1C 9.4 (A) 09/30/2023   HGBA1C 11.5 (A) 06/28/2023   HGBA1C 13.3 (A) 03/29/2023  Uncontrolled type 2 diabetes mellitus with recent increase in blood glucose levels. Metformin and glyburide not tolerated. Discussed insulin  dose adjustment and use of Libre monitor. - Check hemoglobin A1c. - Continue current insulin  regimen. - Continue Ozempic  at 2 mg weekly. - Discuss potential use of Libre monitor for glucose monitoring. - Schedule follow-up to discuss diabetes management.       Relevant Medications   Continuous Glucose Sensor (FREESTYLE LIBRE 3 PLUS SENSOR) MISC   Continuous Glucose Receiver (FREESTYLE LIBRE 3 READER) DEVI   Other Relevant Orders   HgB A1c   Comprehensive Metabolic Panel (CMET)   Urine Microalbumin w/creat. ratio   Lipid Profile   Hyperlipidemia associated with type 2 diabetes mellitus (HCC)   Was previously managed on atorvastatin  40 mg No recent lipid panels in chart      Relevant Orders   Lipid Profile   Class 1 obesity with serious comorbidity and body mass index (BMI) of 32.0 to 32.9 in adult With associated HTN HLD DM   Other Visit Diagnoses       Acute left-sided low back pain with left-sided sciatica    -  Primary - spend most of OV discussing and evaluating this today  left lumbar radiculopathy, acute, w/o injury, severe pain radiating from left low back down the leg, not responding to current medications onset more than 2 weeks ago - Increase meloxicam  dosage, monitor kidney function. - Prescribe a burst of steroids, consider alternative steroids due to past adverse reaction to prednisone. - IM injection given today and pt monitored for response for more than 20 min, no reaction - Refer to physical therapy for assessment and rehabilitation. - Order lumbar and sacral spine x-ray. - Avoid lifting over 5-10 pounds. -continue muscle relaxer as needed -reviewed some  starting simple stretches -SLR + concern for radiculopathy - needs for steroids and pt Use OTC tylenol  as well for pain, heat tx, topical meds ok    Relevant Medications   meloxicam  (MOBIC ) 7.5 MG tablet   cyclobenzaprine  (FLEXERIL ) 5 MG tablet   meloxicam  (MOBIC ) 15 MG tablet   dexamethasone  (DECADRON ) injection 10 mg (Completed)   ketorolac  (TORADOL ) injection 60 mg (Completed)   Other Relevant Orders   Ambulatory referral to Orthopedic Surgery   Ambulatory referral to Physical Therapy   DG Lumbar Spine Complete     Encounter for medical examination to establish care      Establishing from other PCP in same system - reviewed last OV labs meds and plans   Relevant Orders   HgB A1c   Comprehensive Metabolic Panel (CMET)   CBC with Differential/Platelet   Lipid Profile     Screening for viral disease  Relevant Orders   HIV antibody (with reflex)     Insulin  dependent type 2 diabetes mellitus (HCC)       Relevant Medications   Continuous Glucose Sensor (FREESTYLE LIBRE 3 PLUS SENSOR) MISC   Continuous Glucose Receiver (FREESTYLE LIBRE 3 READER) DEVI   Other Relevant Orders   HgB A1c   Comprehensive Metabolic Panel (CMET)   Urine Microalbumin w/creat. ratio   CBC with Differential/Platelet   Lipid Profile            Recording duration: 38 minutes    Return for 2-3 week f/up for DM, sugars/ insulin  review .   Michelene Cower, PA-C 07/22/24 10:53 AM

## 2024-07-22 NOTE — Assessment & Plan Note (Signed)
 Not discussed at length today

## 2024-07-22 NOTE — Telephone Encounter (Signed)
 Pharmacy Patient Advocate Encounter   Received notification from CoverMyMeds that prior authorization for FreeStyle Libre 3 Plus Sensor  is required/requested.   Insurance verification completed.   The patient is insured through [facility] MEDICAID .   Per test claim: PA required; PA submitted to above mentioned insurance via Latent Key/confirmation #/EOC [id number] Status is pending

## 2024-07-22 NOTE — Assessment & Plan Note (Signed)
 Was previously managed on atorvastatin  40 mg No recent lipid panels in chart

## 2024-07-22 NOTE — Assessment & Plan Note (Signed)
 BP near goal but is currently holding lisinopril -HCTZ per UC recommendations and is taking norvasc  only Advised to resume both HTN meds and we will monitor with closer f/up OV BP Readings from Last 3 Encounters:  07/22/24 132/86  04/10/24 (!) 158/96  03/31/24 138/88

## 2024-07-23 ENCOUNTER — Ambulatory Visit: Payer: Self-pay | Admitting: Family Medicine

## 2024-07-23 DIAGNOSIS — E1165 Type 2 diabetes mellitus with hyperglycemia: Secondary | ICD-10-CM

## 2024-07-23 DIAGNOSIS — E1159 Type 2 diabetes mellitus with other circulatory complications: Secondary | ICD-10-CM

## 2024-07-23 LAB — CBC WITH DIFFERENTIAL/PLATELET
Absolute Lymphocytes: 1241 {cells}/uL (ref 850–3900)
Absolute Monocytes: 376 {cells}/uL (ref 200–950)
Basophils Absolute: 19 {cells}/uL (ref 0–200)
Basophils Relative: 0.4 %
Eosinophils Absolute: 259 {cells}/uL (ref 15–500)
Eosinophils Relative: 5.5 %
HCT: 45.9 % — ABNORMAL HIGH (ref 35.0–45.0)
Hemoglobin: 14.5 g/dL (ref 11.7–15.5)
MCH: 26.9 pg — ABNORMAL LOW (ref 27.0–33.0)
MCHC: 31.6 g/dL — ABNORMAL LOW (ref 32.0–36.0)
MCV: 85 fL (ref 80.0–100.0)
MPV: 11.7 fL (ref 7.5–12.5)
Monocytes Relative: 8 %
Neutro Abs: 2806 {cells}/uL (ref 1500–7800)
Neutrophils Relative %: 59.7 %
Platelets: 260 Thousand/uL (ref 140–400)
RBC: 5.4 Million/uL — ABNORMAL HIGH (ref 3.80–5.10)
RDW: 12.7 % (ref 11.0–15.0)
Total Lymphocyte: 26.4 %
WBC: 4.7 Thousand/uL (ref 3.8–10.8)

## 2024-07-23 LAB — EXTRA

## 2024-07-23 LAB — COMPREHENSIVE METABOLIC PANEL WITH GFR
AG Ratio: 1.5 (calc) (ref 1.0–2.5)
ALT: 32 U/L — ABNORMAL HIGH (ref 6–29)
AST: 21 U/L (ref 10–35)
Albumin: 4.3 g/dL (ref 3.6–5.1)
Alkaline phosphatase (APISO): 124 U/L (ref 37–153)
BUN/Creatinine Ratio: 15 (calc) (ref 6–22)
BUN: 7 mg/dL (ref 7–25)
CO2: 28 mmol/L (ref 20–32)
Calcium: 10 mg/dL (ref 8.6–10.4)
Chloride: 102 mmol/L (ref 98–110)
Creat: 0.46 mg/dL — ABNORMAL LOW (ref 0.50–1.05)
Globulin: 2.8 g/dL (ref 1.9–3.7)
Glucose, Bld: 269 mg/dL — ABNORMAL HIGH (ref 65–99)
Potassium: 4.7 mmol/L (ref 3.5–5.3)
Sodium: 139 mmol/L (ref 135–146)
Total Bilirubin: 0.4 mg/dL (ref 0.2–1.2)
Total Protein: 7.1 g/dL (ref 6.1–8.1)
eGFR: 108 mL/min/1.73m2 (ref >=60)

## 2024-07-23 LAB — HEMOGLOBIN A1C
Hgb A1c MFr Bld: 11.7 % — ABNORMAL HIGH (ref <5.7)
Mean Plasma Glucose: 289 mg/dL
eAG (mmol/L): 16 mmol/L

## 2024-07-23 LAB — MICROALBUMIN / CREATININE URINE RATIO
Creatinine, Urine: 11 mg/dL — ABNORMAL LOW (ref 20–275)
Microalb, Ur: <0.2 mg/dL

## 2024-07-23 LAB — HIV ANTIBODY (ROUTINE TESTING W REFLEX): HIV 1&2 Ab, 4th Generation: NONREACTIVE

## 2024-07-23 LAB — LIPID PANEL
Cholesterol: 251 mg/dL — ABNORMAL HIGH (ref <200)
HDL: 60 mg/dL (ref >=50)
LDL Cholesterol (Calc): 171 mg/dL — ABNORMAL HIGH
Non-HDL Cholesterol (Calc): 191 mg/dL — ABNORMAL HIGH (ref <130)
Total CHOL/HDL Ratio: 4.2 (calc) (ref <5.0)
Triglycerides: 90 mg/dL (ref <150)

## 2024-07-23 MED ORDER — LANTUS SOLOSTAR 100 UNIT/ML ~~LOC~~ SOPN: 30.0000 [IU] | PEN_INJECTOR | Freq: Every day | SUBCUTANEOUS | 3 refills | Status: DC

## 2024-07-23 MED ORDER — AMLODIPINE BESYLATE 10 MG PO TABS
10.0000 mg | ORAL_TABLET | Freq: Every day | ORAL | 1 refills | Status: AC
Start: 2024-07-23 — End: ?

## 2024-07-23 MED ORDER — SEMAGLUTIDE (2 MG/DOSE) 8 MG/3ML ~~LOC~~ SOPN: 2.0000 mg | PEN_INJECTOR | SUBCUTANEOUS | 1 refills | Status: DC

## 2024-07-23 MED ORDER — LISINOPRIL-HYDROCHLOROTHIAZIDE 20-12.5 MG PO TABS: 1.0000 | ORAL_TABLET | Freq: Every day | ORAL | 1 refills | Status: AC

## 2024-07-23 MED ORDER — ROSUVASTATIN CALCIUM 5 MG PO TABS: 5.0000 mg | ORAL_TABLET | Freq: Every day | ORAL | 1 refills | Status: AC

## 2024-07-30 ENCOUNTER — Encounter: Payer: Self-pay | Admitting: Physical Therapy

## 2024-07-30 ENCOUNTER — Ambulatory Visit: Attending: Family Medicine

## 2024-07-30 DIAGNOSIS — M5442 Lumbago with sciatica, left side: Secondary | ICD-10-CM | POA: Insufficient documentation

## 2024-07-30 DIAGNOSIS — M5416 Radiculopathy, lumbar region: Secondary | ICD-10-CM | POA: Insufficient documentation

## 2024-07-30 DIAGNOSIS — M6281 Muscle weakness (generalized): Secondary | ICD-10-CM | POA: Insufficient documentation

## 2024-07-30 NOTE — Therapy (Signed)
 OUTPATIENT PHYSICAL THERAPY THORACOLUMBAR EVALUATION   Patient Name: Stacy Dixon MRN: [id number] DOB:04-17-1961, 63 y.o., female Today's Date: 07/30/2024  END OF SESSION:  PT End of Session - 07/30/24 0857     Visit Number 1    Number of Visits 17    Date for PT Re-Evaluation 09/24/24    PT Start Time 0900    PT Stop Time 0942    PT Time Calculation (min) 42 min    Activity Tolerance Patient tolerated treatment well    Behavior During Therapy WFL for tasks assessed/performed          Past Medical History:  Diagnosis Date   Allergy    I have seasonal allergies   Anemia    Asthma    Diabetes mellitus without complication (HCC)    GERD (gastroesophageal reflux disease)    Heart murmur    Some question of a history of A.Fib, but on no control meds and patient is in NSR on ED EKG   Hypertension    Past Surgical History:  Procedure Laterality Date   CESAREAN SECTION     CHOLECYSTECTOMY N/A 03/07/2014   Procedure: LAPAROSCOPIC CHOLECYSTECTOMY ;  Surgeon: Dann FORBES Hummer, MD;  Location: [facility] OR;  Service: General;  Laterality: N/A;   COLONOSCOPY WITH PROPOFOL  N/A 04/19/2023   Procedure: COLONOSCOPY WITH PROPOFOL ;  Surgeon: Unk Corinn Skiff, MD;  Location: [facility] ENDOSCOPY;  Service: Gastroenterology;  Laterality: N/A;   ERCP N/A 03/07/2014   Procedure: ENDOSCOPIC RETROGRADE CHOLANGIOPANCREATOGRAPHY (ERCP);  Surgeon: Toribio SHAUNNA Cedar, MD;  Location: [facility] OR;  Service: Endoscopy;  Laterality: N/A;  we are planning to do this at same time as cholecystectomy.    TUBAL LIGATION  12/03/1993   UTERINE FIBROID SURGERY  12/04/2011   Patient Active Problem List   Diagnosis Date Noted   Class 1 obesity with serious comorbidity and body mass index (BMI) of 32.0 to 32.9 in adult 07/22/2024   Cecal polyp 04/19/2023   Uncontrolled type 2 diabetes mellitus with hyperglycemia (HCC) 03/29/2023   Hyperlipidemia associated with type 2 diabetes mellitus (HCC) 03/29/2023   Hypertension  associated with diabetes (HCC) 03/06/2014   Asthma with exacerbation 03/06/2014    PCP: Leavy Mole, PA-C  REFERRING PROVIDER: Leavy Mole, PA-C  REFERRING DIAG: [phone] (ICD-10-CM) - Acute left-sided low back pain with left-sided sciatica  Rationale for Evaluation and Treatment: Rehabilitation  THERAPY DIAG:  Radiculopathy, lumbar region  Muscle weakness (generalized)  ONSET DATE: 2 weeks ago   SUBJECTIVE:  SUBJECTIVE STATEMENT: Patient reports she is having constant pain in LLE which started 2-3 weeks ago. Walking tends to improve symptoms, however when she sits it makes it worse. She has trouble working due to the pain and unable to stand for long periods of time. Unable to stand >30 minutes without requiring seated rest breaks. Lower leg is numb and pain is more in the lateral thigh and hip on LLE.   PERTINENT HISTORY:  Per MD note on 07/22/24, Low back pain with sciatica (left side) onset 2 weeks ago with + left SLR, limited ROM and left lumbar paraspinal tenderness, no improvement with resting, mobic  and muscle relaxers Pain worse with movement and trouble sleeping, sitting does not increase pain Previous similar episodes occurred ten years ago, which resolved with walking and rest, but current episode is more severe and persistent.  PAIN:  Are you having pain? Yes: NPRS scale: 9/10 Pain location: L LE (lateral thigh) Pain description: intense, sharp, shock Aggravating factors: sitting, standing for prolonged periods of time Relieving factors: walking, heating pad   PRECAUTIONS: None  RED FLAGS: None   WEIGHT BEARING RESTRICTIONS: No  FALLS:  Has patient fallen in last 6 months? 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  DIAGNOSTIC FINDINGS:  N/A   PATIENT SURVEYS:  Modified Oswestry:  MODIFIED OSWESTRY DISABILITY SCALE  Date: 07/30/24 Score  Pain intensity 0 = I can tolerate the pain I have without having to use pain medication.  2. Personal care (washing, dressing, etc.) 1 =  I can take care of myself normally, but it increases my pain.  3. Lifting 2 = Pain prevents me from lifting heavy weights off the floor, activities (eg. sports, dancing). but I can manage if the weights are conveniently positioned (3) Pain prevents me from going out very often. (eg, on a table).  4. Walking 1 = Pain prevents me from walking more than 1 mile.  5. Sitting 2 =  Pain prevents me from sitting more than 1 hour.  6. Standing 1 =  I can stand as long as I want but, it increases my pain.  7. Sleeping 2 =  Even when I take pain medication, I sleep less than 6 hours  8. Social Life 1 =  My social life is normal, but it increases my level of pain.  9. Traveling 1 =  I can travel anywhere, but it increases my pain.  10. Employment/ Homemaking 1 = My normal homemaking/job activities increase my pain, but I can still perform all that is required of me  Total 12/50   Interpretation of scores: Score Category Description  0-20% Minimal Disability The patient can cope with most living activities. Usually no treatment is indicated apart from advice on lifting, sitting and exercise  21-40% Moderate Disability The patient experiences more pain and difficulty with sitting, lifting and standing. Travel and social life are more difficult and they may be disabled from work. Personal care, sexual activity and sleeping are not grossly affected, and the patient can usually be managed by  conservative means  41-60% Severe Disability Pain remains the main problem in this group, but activities of daily living are affected. These patients require  a detailed investigation  61-80% Crippled Back pain impinges on all aspects of the patient's life. Positive intervention is required  81-100% Bed-bound  These patients are either bed-bound or exaggerating their symptoms  Bluford FORBES Zoe DELENA Karon DELENA, et al. Surgery versus conservative management of stable thoracolumbar fracture: the PRESTO feasibility RCT. [place] ([place]): [employer]; 2021 Nov. [facility] [employer] Assessment, No. 25.62.) Appendix 3, Oswestry Disability Index category descriptors. Available from: FindJewel[employer].cz  Minimally Clinically Important Difference (MCID) = 12.8%  COGNITION: Overall cognitive status: Within functional limits for tasks assessed     SENSATION: Light touch: Impaired  and L lower leg with impaired light touch   MUSCLE LENGTH: Hamstrings: Right 70 deg; Left 55 deg  POSTURE: rounded shoulders and forward head  PALPATION: TTP to L piriformis   LUMBAR ROM:   AROM eval  Flexion WFL  Extension WFL  Right lateral flexion WFL  Left lateral flexion WFL  Right rotation WFL  Left rotation WFL   (Blank rows = not tested)  LOWER EXTREMITY ROM:     Active  Right eval Left eval  Hip flexion    Hip extension    Hip abduction    Hip adduction    Hip internal rotation    Hip external rotation    Knee flexion    Knee extension    Ankle dorsiflexion    Ankle plantarflexion    Ankle inversion    Ankle eversion     (Blank rows = not tested)  LOWER EXTREMITY MMT:    MMT Right eval Left eval  Hip flexion 4+ 4  Hip extension    Hip abduction 4+ 4  Hip adduction 4+ 4+  Hip internal rotation    Hip external rotation    Knee flexion 4+ 4  Knee extension 4+ 4  Ankle dorsiflexion 4+ 4+  Ankle plantarflexion 4+ 4+  Ankle inversion    Ankle eversion      (Blank rows = not tested)  LUMBAR SPECIAL TESTS:  Straight leg raise test: Positive and FABER test: Negative  FUNCTIONAL TESTS:  5 times sit to stand: 10.38 seconds 6 minute walk test: NT   GAIT: Distance walked: 50'  Assistive device utilized: None Level of assistance: Complete Independence Comments: decreased stance time on L   TREATMENT DATE: 07/30/24                                                                                                                                PATIENT EDUCATION:  Education details: HEP, POC, goal of therapy  Person educated: Patient Education method: Explanation, Demonstration, and Handouts Education comprehension: verbalized understanding and returned demonstration  HOME EXERCISE PROGRAM: Access Code: [email or web address] URL: [email or web address] Date: 07/30/2024 Prepared by: Maryanne Finder  Exercises - Seated Piriformis Stretch  - 2-3 x daily - 5-7 x weekly - 3-5 reps - 30-60 seconds hold - Seated Hamstring Stretch  - 2-3 x daily - 5-7 x  weekly - 3-5 reps - 30-60 second hold - Standing Piriformis Release with Ball at Wall  - 2 x daily - 5-7 x weekly - 1-2 minutes hold  ASSESSMENT:  CLINICAL IMPRESSION: Patient is a 63 y.o. female who was seen today for physical therapy evaluation and treatment for low back pain with radiating pain down L LE. Patient with significant tightness noted to L piriformis with palpation and stretching. Performed supine piriformis stretch as well as introduced seated piriformis stretch and self mobilization with tennis ball on wall for piriformis release. Tolerated very well with resolution of L LE symptoms at end of session. Discussed with patient about if symptoms continue to be resolved, to inform our clinic and d/c from PT, however if symptoms persists, encouraged her to continue with PT. Patient will benefit from skilled PT interventions to address listed deficits to improve quality of life and decrease  pain.   OBJECTIVE IMPAIRMENTS: decreased activity tolerance, decreased endurance, decreased mobility, difficulty walking, decreased ROM, decreased strength, impaired flexibility, impaired sensation, postural dysfunction, and pain.   ACTIVITY LIMITATIONS: lifting, bending, sitting, squatting, sleeping, and caring for others  PARTICIPATION LIMITATIONS: cleaning, laundry, community activity, and occupation  PERSONAL FACTORS: Age, Behavior pattern, Past/current experiences, and Profession are also affecting patient's functional outcome.   REHAB POTENTIAL: Good  CLINICAL DECISION MAKING: Stable/uncomplicated  EVALUATION COMPLEXITY: Moderate   GOALS: Goals reviewed with patient? Yes  SHORT TERM GOALS: Target date: 08/27/2024  Patient will be independent in HEP to improve strength/mobility for better functional independence with ADLs. Baseline: Goal status: INITIAL   LONG TERM GOALS: Target date: 09/24/2024  Patient will reduce modified Oswestry score to <20 as to demonstrate minimal disability with ADLs including improved sleeping tolerance, walking/sitting tolerance etc for better mobility with ADLs.  Baseline: 8/28: 24% Goal status: INITIAL  2.  Patient will report <2/10 radicular pain symptoms in LLE to demonstrate improved ability tolerate daily activities.  Baseline:  Goal status: INITIAL  3.  Patient will demonstrate proper lifting mechanics to simulate job duties to reduce low back strain.  Baseline:  Goal status: INITIAL  PLAN:  PT FREQUENCY: 1-2x/week  PT DURATION: 8 weeks  PLANNED INTERVENTIONS: 97164- PT Re-evaluation, 97750- Physical Performance Testing, 97110-Therapeutic exercises, 97530- Therapeutic activity, 97112- Neuromuscular re-education, 97535- Self Care, [id number]- Manual therapy, [phone]- Gait training, Patient/Family education, Balance training, Stair training, Joint mobilization, Joint manipulation, Spinal manipulation, Spinal mobilization, DME instructions,  Cryotherapy, and Moist heat.  PLAN FOR NEXT SESSION: Architectural technologist,   Maryanne Finder, PT, DPT Physical Therapist - [place]  [facility] 07/30/2024, 12:11 PM

## 2024-07-31 DIAGNOSIS — H5213 Myopia, bilateral: Secondary | ICD-10-CM | POA: Diagnosis not present

## 2024-08-03 DIAGNOSIS — R1013 Epigastric pain: Secondary | ICD-10-CM | POA: Diagnosis not present

## 2024-08-03 DIAGNOSIS — R112 Nausea with vomiting, unspecified: Secondary | ICD-10-CM | POA: Diagnosis not present

## 2024-08-03 DIAGNOSIS — K449 Diaphragmatic hernia without obstruction or gangrene: Secondary | ICD-10-CM | POA: Diagnosis not present

## 2024-08-03 DIAGNOSIS — J189 Pneumonia, unspecified organism: Secondary | ICD-10-CM | POA: Diagnosis not present

## 2024-08-03 DIAGNOSIS — I10 Essential (primary) hypertension: Secondary | ICD-10-CM | POA: Diagnosis not present

## 2024-08-03 DIAGNOSIS — R000 Tachycardia, unspecified: Secondary | ICD-10-CM | POA: Diagnosis not present

## 2024-08-03 DIAGNOSIS — E119 Type 2 diabetes mellitus without complications: Secondary | ICD-10-CM | POA: Diagnosis not present

## 2024-08-08 DIAGNOSIS — R112 Nausea with vomiting, unspecified: Secondary | ICD-10-CM | POA: Diagnosis not present

## 2024-08-12 ENCOUNTER — Ambulatory Visit: Admitting: Family Medicine

## 2024-08-12 ENCOUNTER — Encounter: Payer: Self-pay | Admitting: Family Medicine

## 2024-08-12 VITALS — BP 136/78 | HR 91 | Resp 16 | Ht 62.0 in | Wt 172.0 lb

## 2024-08-12 DIAGNOSIS — E119 Type 2 diabetes mellitus without complications: Secondary | ICD-10-CM

## 2024-08-12 DIAGNOSIS — K30 Functional dyspepsia: Secondary | ICD-10-CM | POA: Diagnosis not present

## 2024-08-12 DIAGNOSIS — Z794 Long term (current) use of insulin: Secondary | ICD-10-CM | POA: Diagnosis not present

## 2024-08-12 DIAGNOSIS — K449 Diaphragmatic hernia without obstruction or gangrene: Secondary | ICD-10-CM

## 2024-08-12 DIAGNOSIS — I152 Hypertension secondary to endocrine disorders: Secondary | ICD-10-CM

## 2024-08-12 DIAGNOSIS — E785 Hyperlipidemia, unspecified: Secondary | ICD-10-CM

## 2024-08-12 DIAGNOSIS — E1165 Type 2 diabetes mellitus with hyperglycemia: Secondary | ICD-10-CM

## 2024-08-12 DIAGNOSIS — E1169 Type 2 diabetes mellitus with other specified complication: Secondary | ICD-10-CM | POA: Diagnosis not present

## 2024-08-12 DIAGNOSIS — Z09 Encounter for follow-up examination after completed treatment for conditions other than malignant neoplasm: Secondary | ICD-10-CM

## 2024-08-12 DIAGNOSIS — E1159 Type 2 diabetes mellitus with other circulatory complications: Secondary | ICD-10-CM | POA: Diagnosis not present

## 2024-08-12 MED ORDER — LANTUS SOLOSTAR 100 UNIT/ML ~~LOC~~ SOPN: 15.0000 [IU] | PEN_INJECTOR | Freq: Two times a day (BID) | SUBCUTANEOUS | Status: AC

## 2024-08-12 NOTE — Assessment & Plan Note (Signed)
 Here today for f/up on DM, newly established care here on 8/20 Newer dx with improving labs over the past 1-2 years but not yet at goal, unfortunately labs showed sig A1c increase and hyperglycemia Lab Results  Component Value Date   HGBA1C 11.7 (H) 07/22/2024  Since OV 3 weeks ago she has stopped ozempic  due to GI SE concerns Insulin  dose overall decreased from 35 units at bedtime to 15 units qam and 17 u qpm for the last 1-2 days She has CGM libre 3+ sensors and reader but hasn't used it yet. Sugars going high >300 GI sx -discussed how GI symptoms can be related to hyperglycemia or possibly gastroparesis Unfortunately she has sensitivities to multiple medications and cannot tolerate glipizide  metformin and now she wishes to stop Ozempic  completely I have encouraged her to consider restarting Ozempic  at a lower dose since it was previously helping her blood sugars get better controlled.  For now she wishes to only use insulin  so we reviewed insulin  titration and how to monitor her fasting blood sugars which she can do his CGM or do with glucometer She has only recently split her dosing to twice a day per ER recommendations encouraged her to wait 3 to 5 days at the same dosing before doing a dose titration.  With a fasting blood sugar goal between 80 and less than 150.  Reviewed with her thoroughly that if her fasting sugars are greater than 150 she should do a insulin  dose increase which could look like 17 units twice a day and then wait another 3 to 5 days to monitor and reassess Encouraged her to do a pharmacy consult which may help in education, insulin  titration or additional resources to help for accessing or using the [employer] and discussed diabetes educator and nutritionist or registered dietitian consult but she declined at this time If blood sugars do not improve in the next few months it may be appropriate to have her consult with endocrinologist in case she may be needing basal  and mealtime insulin  which would be a significant change for her and she would like a specialist referral. She is having some symptoms of her hyperglycemia and uncontrolled diabetes possibly some left lower extremity neuropathy but this may also be due to low back pain and sciatic issues, GI symptoms, she is concerned with weight loss and fatigue but I did explain to her that having uncontrolled blood sugars also could cause her to lose weight so she may want to reconsider restarting Ozempic  later after letting her stomach settle for a little bit, further she may have improved GI symptoms with a little rest from all these medications and with improving her sugars Plan for close 2-week follow-up with myself or the pharmacist  HPI from very recent office visit: Currently sounds like only on ozempic  and insulin , GI SE and intolerance with metformin and glipizide    Lab Results  Component Value Date   HGBA1C 11.7 (H) 07/22/2024   HGBA1C 8.5 (A) 03/31/2024   HGBA1C 9.8 (A) 12/31/2023   HGBA1C 9.4 (A) 09/30/2023   HGBA1C 11.5 (A) 06/28/2023  Uncontrolled type 2 diabetes mellitus with recent increase in blood glucose levels. Metformin and glyburide not tolerated. Discussed insulin  dose adjustment and use of Libre monitor. - Check hemoglobin A1c. - Continue current insulin  regimen. - Continue Ozempic  at 2 mg weekly. - Discuss potential use of Libre monitor for glucose monitoring.

## 2024-08-12 NOTE — Assessment & Plan Note (Signed)
 Blood pressure is slightly above goal today but patient is extremely stressed with multiple issues including health changes, uncontrolled labs, job stress, recent divorce etc.  At this time continue amlodipine  10 mg and lisinopril  HCTZ 20-12.5 If at next OV BP is still elevated above goal (<120/70) then would consider increasing lisinopril -HCTZ dose at that time BP Readings from Last 3 Encounters:  08/12/24 136/78  07/22/24 132/86  04/10/24 (!) 158/96

## 2024-08-12 NOTE — Assessment & Plan Note (Addendum)
 Lipids done with last set of labs Very uncontrolled on prior meds atorvastatin  40 mg 9compliance unclear but with result note we had planned to change to crestor   Recheck labs (LP and CMP) after 3-4 months Lab Results  Component Value Date   CHOL 251 (H) 07/22/2024   HDL 60 07/22/2024   LDLCALC 171 (H) 07/22/2024   TRIG 90 07/22/2024   CHOLHDL 4.2 07/22/2024

## 2024-08-12 NOTE — Progress Notes (Signed)
 Name: Stacy Dixon   MRN: [id number]    DOB: 1961-09-28   Date:08/12/2024       Progress Note  Chief Complaint  Patient presents with   Medical Management of Chronic Issues   Diabetes     Subjective:   Stacy Dixon is a 63 y.o. female, presents to clinic for routine follow up on chronic conditions  Here for f/up on abnormal labs, uncontrolled DM with recently est care here a few weeks ago  Insulin  lantus  - 35 units at bedtime  She has been having morning fasting sugars 270-300's ED recommended she may try BID dosing  15 units and 17 units, she's done this for the past 1-2 days Sugar this am was 189's   She does not want to restart ozempic  - GI concerns/reflux/N/V Shedid tolerate lower doses but concern with SE and weight loss GI SE intolerable with sulfonylureas and metformin  She does have freestyle libre continuous glucose monitor but has not applied it to her arm or figured out how to use the reader  Discussed the use of AI scribe software for clinical note transcription with the patient, who gave verbal consent to proceed.  History of Present Illness Stacy Dixon is a 63 year old female with diabetes who presents with concerns about medication side effects and blood sugar management.  Hyperglycemia and glycemic control - Diabetes mellitus with persistent hyperglycemia despite insulin  therapy - Current insulin  regimen: Lantus  35 units nightly; attempted split dosing to 15 units in the morning and 17 units at night - Morning blood glucose 189 mg/dL after split dosing; previously 300-400 mg/dL - Difficulty achieving glycemic control - Significant weight loss attributed to medication and hyperglycemia - Difficulty managing diabetes due to demanding job and psychosocial stressors  Adverse effects of antidiabetic medication - Severe nausea, vomiting, and indigestion associated with Ozempic  use with high dose - she did tolerate lower doses in the past but past  PCP increased it despite her reported concerns - Recent hospital visit for symptoms initially suspected to be a myocardial infarction, later attributed to medication side effects - Discontinued Ozempic  due to intolerable adverse effects (d/c in the last week)  Gastrointestinal symptoms - History of small hiatal hernia - Indigestion and acid reflux symptoms - med from ER prescribed with symptomatic improvement (believe this was zofran )  Psychosocial stressors - High stress related to work environment and personal life - Recent divorce - [place] challenging neighborhood - Stress contributing to difficulty in diabetes management     Current Outpatient Medications:    ACCU-CHEK GUIDE TEST test strip, 1 EACH BY IN VITRO ROUTE 2 (TWO) TIMES DAILY. MAY SUBSTITUTE TO ANY MANUFACTURER COVERED BY PATIENT'S INSURANCE. HAS A TRUPLUS METER AT HOME, Disp: 100 strip, Rfl: 5   Accu-Chek Softclix Lancets lancets, PLEASE SEE ATTACHED FOR DETAILED DIRECTIONS, Disp: , Rfl:    amLODipine  (NORVASC ) 10 MG tablet, Take 1 tablet (10 mg total) by mouth daily., Disp: 90 tablet, Rfl: 1   ASPIRIN  LOW DOSE 81 MG tablet, TAKE 1 TABLET BY MOUTH EVERY DAY, Disp: 90 tablet, Rfl: 1   BD PEN NEEDLE NANO 2ND GEN 32G X 4 MM MISC, USE AS DIRECTED, Disp: 100 each, Rfl: 1   Blood Glucose Monitoring Suppl DEVI, 1 each by Does not apply route in the morning, at noon, and at bedtime. May substitute to any manufacturer covered by patient's insurance., Disp: 1 each, Rfl: 0   cetirizine  (ZYRTEC ) 10 MG tablet, TAKE 1  TABLET BY MOUTH EVERY DAY, Disp: 30 tablet, Rfl: 11   Continuous Glucose Receiver (FREESTYLE LIBRE 3 READER) DEVI, Disp one reader device to be used with libre 3 sensors for glucose monitoring, Disp: 1 each, Rfl: 0   Continuous Glucose Sensor (FREESTYLE LIBRE 3 PLUS SENSOR) MISC, Change sensor every 15 days., Disp: 6 each, Rfl: 1   cyclobenzaprine  (FLEXERIL ) 5 MG tablet, Take 1-2 tablets (5-10 mg total) by mouth 3  (three) times daily as needed., Disp: 60 tablet, Rfl: 0   insulin  glargine (LANTUS  SOLOSTAR) 100 UNIT/ML Solostar Pen, Inject 30-50 Units into the skin at bedtime., Disp: 45 mL, Rfl: 3   Lancets Misc. MISC, 1 each by Does not apply route in the morning and at bedtime. May substitute to any manufacturer covered by patient's insurance., Disp: 100 each, Rfl: 5   lisinopril -hydrochlorothiazide  (ZESTORETIC ) 20-12.5 MG tablet, Take 1 tablet by mouth daily., Disp: 90 tablet, Rfl: 1   meclizine  (ANTIVERT ) 25 MG tablet, Take 1 tablet (25 mg total) by mouth 3 (three) times daily as needed for dizziness., Disp: 30 tablet, Rfl: 0   meloxicam  (MOBIC ) 15 MG tablet, Take 1 tablet (15 mg total) by mouth daily as needed for pain., Disp: 30 tablet, Rfl: 1   Multiple Vitamins-Minerals (MULTIVITAMIN WITH MINERALS) tablet, Take 1 tablet by mouth daily., Disp: , Rfl:    rosuvastatin  (CRESTOR ) 5 MG tablet, Take 1 tablet (5 mg total) by mouth daily., Disp: 90 tablet, Rfl: 1   Semaglutide , 2 MG/DOSE, 8 MG/3ML SOPN, Inject 2 mg as directed once a week., Disp: 9 mL, Rfl: 1   VENTOLIN  HFA 108 (90 Base) MCG/ACT inhaler, INHALE 2 PUFFS BY MOUTH EVERY 6 HOURS AS NEEDED FOR WHEEZE OR SHORTNESS OF BREATH, Disp: 18 each, Rfl: 1  Patient Active Problem List   Diagnosis Date Noted   Class 1 obesity with serious comorbidity and body mass index (BMI) of 32.0 to 32.9 in adult 07/22/2024   Cecal polyp 04/19/2023   Uncontrolled type 2 diabetes mellitus with hyperglycemia (HCC) 03/29/2023   Hyperlipidemia associated with type 2 diabetes mellitus (HCC) 03/29/2023   Hypertension associated with diabetes (HCC) 03/06/2014   Asthma with exacerbation 03/06/2014    Past Surgical History:  Procedure Laterality Date   CESAREAN SECTION     CHOLECYSTECTOMY N/A 03/07/2014   Procedure: LAPAROSCOPIC CHOLECYSTECTOMY ;  Surgeon: Dann FORBES Hummer, MD;  Location: [facility] OR;  Service: General;  Laterality: N/A;   COLONOSCOPY WITH PROPOFOL  N/A 04/19/2023    Procedure: COLONOSCOPY WITH PROPOFOL ;  Surgeon: Unk Corinn Skiff, MD;  Location: [facility] ENDOSCOPY;  Service: Gastroenterology;  Laterality: N/A;   ERCP N/A 03/07/2014   Procedure: ENDOSCOPIC RETROGRADE CHOLANGIOPANCREATOGRAPHY (ERCP);  Surgeon: Toribio SHAUNNA Cedar, MD;  Location: [facility] OR;  Service: Endoscopy;  Laterality: N/A;  we are planning to do this at same time as cholecystectomy.    TUBAL LIGATION  12/03/1993   UTERINE FIBROID SURGERY  12/04/2011    Family History  Problem Relation Age of Onset   Lung cancer Mother    Alcohol abuse Mother    Arthritis Mother    COPD Mother    Hypertension Mother    Lung cancer Father    Asthma Father    Drug abuse Father    Heart disease Father    Cancer Maternal Grandmother    Breast cancer Cousin 63   Diabetes Sister     Social History   Tobacco Use   Smoking status: Never   Smokeless tobacco: Never  Vaping  Use   Vaping status: Never Used  Substance Use Topics   Alcohol use: Yes    Comment: socially   Drug use: No     Allergies  Allergen Reactions   Prednisone Anaphylaxis   Metformin And Related Diarrhea    Health Maintenance  Topic Date Due   Cervical Cancer Screening (HPV/Pap Cotest)  08/01/2021   FOOT EXAM  03/28/2024   COVID-19 Vaccine (3 - 2025-26 season) 08/28/2024 (Originally 08/03/2024)   Zoster Vaccines- Shingrix (2 of 2) 10/21/2024 (Originally 08/03/2023)   Influenza Vaccine  03/02/2025 (Originally 07/03/2024)   DTaP/Tdap/Td (1 - Tdap) 07/21/2025 (Originally 09/26/1980)   HEMOGLOBIN A1C  01/22/2025   MAMMOGRAM  05/16/2025   OPHTHALMOLOGY EXAM  07/20/2025   Diabetic kidney evaluation - eGFR measurement  07/22/2025   Diabetic kidney evaluation - Urine ACR  07/22/2025   Colonoscopy  04/18/2033   Pneumococcal Vaccine: 50+ Years  Completed   Hepatitis C Screening  Completed   HIV Screening  Completed   Hepatitis B Vaccines 19-59 Average Risk  Aged Out   HPV VACCINES  Aged Out   Meningococcal B Vaccine  Aged Out     Chart Review Today: I personally reviewed active problem list, medication list, allergies, family history, social history, health maintenance, notes from last encounter, lab results, imaging with the patient/caregiver today.   Review of Systems  Constitutional: Negative.   HENT: Negative.    Eyes: Negative.   Respiratory: Negative.    Cardiovascular: Negative.   Gastrointestinal: Negative.   Endocrine: Negative.   Genitourinary: Negative.   Musculoskeletal: Negative.   Skin: Negative.   Allergic/Immunologic: Negative.   Neurological: Negative.   Hematological: Negative.   Psychiatric/Behavioral: Negative.    All other systems reviewed and are negative.    Objective:   Vitals:   08/12/24 0936  BP: 136/78  Pulse: 91  Resp: 16  SpO2: 100%  Weight: 172 lb (78 kg)  Height: 5' 2 (1.575 m)    Body mass index is 31.46 kg/m.  Physical Exam Vitals and nursing note reviewed.  Constitutional:      General: She is not in acute distress.    Appearance: Normal appearance. She is well-developed and overweight. She is not ill-appearing, toxic-appearing or diaphoretic.  HENT:     Head: Normocephalic and atraumatic.     Right Ear: External ear normal.     Left Ear: External ear normal.     Nose: Nose normal.  Eyes:     General: No scleral icterus.       Right eye: No discharge.        Left eye: No discharge.     Conjunctiva/sclera: Conjunctivae normal.  Neck:     Trachea: No tracheal deviation.  Cardiovascular:     Rate and Rhythm: Normal rate.     Pulses:          Dorsalis pedis pulses are 2+ on the right side and 2+ on the left side.       Posterior tibial pulses are 2+ on the right side and 2+ on the left side.  Pulmonary:     Effort: Pulmonary effort is normal. No respiratory distress.     Breath sounds: No stridor.  Feet:     Right foot:     Skin integrity: Skin integrity normal.     Toenail Condition: Right toenails are normal.     Left foot:     Skin  integrity: Skin integrity normal.     Toenail Condition:  Left toenails are normal.     Comments: DM foot exam done - see below Skin:    General: Skin is warm and dry.     Findings: No rash.  Neurological:     Mental Status: She is alert.     Motor: No abnormal muscle tone.     Coordination: Coordination normal.     Gait: Gait normal.  Psychiatric:        Mood and Affect: Mood normal.        Behavior: Behavior normal. Behavior is cooperative.     Diabetic Foot Exam - Simple   Simple Foot Form Diabetic Foot exam was performed with the following findings: Yes 08/12/2024  9:58 AM  Visual Inspection No deformities, no ulcerations, no other skin breakdown bilaterally: Yes Sensation Testing Intact to touch and monofilament testing bilaterally: Yes Pulse Check Posterior Tibialis and Dorsalis pulse intact bilaterally: Yes Comments      Functional Status Survey:   Results for orders placed or performed in visit on 07/23/24  HM DIABETES EYE EXAM   Collection Time: 07/20/24 10:25 AM  Result Value Ref Range   HM Diabetic Eye Exam No Retinopathy No Retinopathy      Assessment & Plan:   Problem List Items Addressed This Visit       Cardiovascular and Mediastinum   Hypertension associated with diabetes (HCC)   Blood pressure is slightly above goal today but patient is extremely stressed with multiple issues including health changes, uncontrolled labs, job stress, recent divorce etc.  At this time continue amlodipine  10 mg and lisinopril  HCTZ 20-12.5 If at next OV BP is still elevated above goal (<120/70) then would consider increasing lisinopril -HCTZ dose at that time BP Readings from Last 3 Encounters:  08/12/24 136/78  07/22/24 132/86  04/10/24 (!) 158/96               Endocrine   Uncontrolled type 2 diabetes mellitus with hyperglycemia (HCC) - Primary   Here today for f/up on DM, newly established care here on 8/20, at that time it was a newer dx with improving labs  over the past 1-2 years but not yet at goal, unfortunately labs showed sig A1c increase and hyperglycemia, very uncontrolled Lab Results  Component Value Date   HGBA1C 11.7 (H) 07/22/2024  Since OV 3 weeks ago she has stopped ozempic  due to GI SE concerns Insulin  dose overall decreased from 35 units at bedtime to 15 units qam and 17 u qpm for the last 1-2 days (down from 35 to 32 total daily in the setting of hyperglycemia w/o hypoglycemic episodes) She has CGM libre 3+ sensors and reader but hasn't used it yet. Sugars going high >300 with glucometer GI sx -discussed how GI symptoms can be related to hyperglycemia or possibly gastroparesis Unfortunately she has sensitivities to multiple medications and cannot tolerate glipizide  metformin and now she wishes to stop Ozempic  completely I have encouraged her to consider restarting Ozempic  at a lower dose since it was previously helping with A1c/glycemic control.   For now she wishes to only use insulin  so we reviewed insulin  titration and how to monitor her fasting blood sugars which she can do his CGM or do with glucometer She has only recently split her dosing to twice a day per ER recommendations I encouraged her to wait 3 to 5 days at the same dosing before doing an insulin  dose titration.  With a fasting blood sugar goal between 80 and less than 150.  Reviewed with her thoroughly that if her fasting sugars are greater than 150 she should do a insulin  dose increase which could look like 17 units twice a day and then wait another 3 to 5 days to monitor and reassess before additional dose changes. Encouraged her to do a pharmacy consult which may help in education, insulin  titration or additional resources to help for accessing or using the [employer] and discussed diabetes educator and nutritionist or registered dietitian consult but she declined at this time If blood sugars do not improve in the next few months it may be appropriate to have  her consult with endocrinologist in case she may be needing basal and mealtime insulin  which would be a significant change for her and she would like a specialist referral. She is having some symptoms of her hyperglycemia and uncontrolled diabetes possibly some left lower extremity neuropathy but this may also be due to low back pain and sciatic issues, GI symptoms, she is concerned with weight loss and fatigue but I did explain to her that having uncontrolled blood sugars also could cause her to lose weight so she may want to reconsider restarting Ozempic  later after letting her stomach settle for a little bit, further she may have improved GI symptoms with a little rest from all these medications and with improving her sugars Plan for close 2-week follow-up with myself or the pharmacist  HPI from last office visit: Currently sounds like only on ozempic  and insulin , GI SE and intolerance with metformin and glipizide    Lab Results  Component Value Date   HGBA1C 11.7 (H) 07/22/2024   HGBA1C 8.5 (A) 03/31/2024   HGBA1C 9.8 (A) 12/31/2023   HGBA1C 9.4 (A) 09/30/2023   HGBA1C 11.5 (A) 06/28/2023  Uncontrolled type 2 diabetes mellitus with recent increase in blood glucose levels. Metformin and glyburide not tolerated. Discussed insulin  dose adjustment and use of Libre monitor. - Check hemoglobin A1c. - Continue current insulin  regimen. - Continue Ozempic  at 2 mg weekly. - Discuss potential use of Libre monitor for glucose monitoring.        Relevant Medications   insulin  glargine (LANTUS  SOLOSTAR) 100 UNIT/ML Solostar Pen   Other Relevant Orders   HM Diabetes Foot Exam (Completed)   Ambulatory referral to Endocrinology   AMB Referral VBCI Care Management   Hyperlipidemia associated with type 2 diabetes mellitus (HCC)   Lipids done with last set of labs Very uncontrolled on prior meds atorvastatin  40 mg 9compliance unclear but with result note we had planned to change to crestor   Recheck  labs (LP and CMP) after 3-4 months Lab Results  Component Value Date   CHOL 251 (H) 07/22/2024   HDL 60 07/22/2024   LDLCALC 171 (H) 07/22/2024   TRIG 90 07/22/2024   CHOLHDL 4.2 07/22/2024              Other Visit Diagnoses       Insulin  dependent type 2 diabetes mellitus (HCC)       see above   Relevant Medications   insulin  glargine (LANTUS  SOLOSTAR) 100 UNIT/ML Solostar Pen     Encounter for examination following treatment at hospital       reviewed ER visit records, results, d/c summary, meds, imaging and reviewed all with pt, abd pain/GI/chest sx all resolved     Hiatal hernia       small noted on ED CT abd/pelvis - educated pt on what this is and what sx it can cause and  how it may be managed -PPI prn or long term     Indigestion       consider PPI to get sx controlled, watch for food triggers, diet/lifestyle changes recommended         Return for 2 week f/up virtual or in person for insulin  and glucose f/up.   Michelene Cower, PA-C 08/12/24 9:57 AM

## 2024-08-18 ENCOUNTER — Ambulatory Visit: Attending: Family Medicine

## 2024-08-18 ENCOUNTER — Telehealth: Payer: Self-pay

## 2024-08-18 DIAGNOSIS — M5416 Radiculopathy, lumbar region: Secondary | ICD-10-CM | POA: Diagnosis present

## 2024-08-18 DIAGNOSIS — M6281 Muscle weakness (generalized): Secondary | ICD-10-CM | POA: Insufficient documentation

## 2024-08-18 NOTE — Progress Notes (Signed)
 Complex Care Management Note  Care Guide Note 08/18/2024 Name: SAFIYA GIRDLER MRN: [id number] DOB: 1961-12-03  FREDRICA CAPANO is a 63 y.o. year old female who sees Tapia, Leisa, PA-C for primary care. I reached out to Jerel JONETTA Glatter by phone today to offer complex care management services.  Ms. Martindelcampo was given information about Complex Care Management services today including:   The Complex Care Management services include support from the care team which includes your Nurse Care Manager, Clinical Social Worker, or Pharmacist.  The Complex Care Management team is here to help remove barriers to the health concerns and goals most important to you. Complex Care Management services are voluntary, and the patient may decline or stop services at any time by request to their care team member.   Complex Care Management Consent Status: Patient wishes to consider information provided and/or speak with a member of the care team before deciding to participate in complex care management services.   Follow up plan:  The care guide will reach out to the patient again over the next 7 days.  Encounter Outcome:  Patient Request to Call Back  Dreama [facility]  [facility], [facility] VBCI Assistant Direct Dial: [phone]  Fax: [phone]

## 2024-08-18 NOTE — Therapy (Signed)
 OUTPATIENT PHYSICAL THERAPY TREATMENT And Discharge Summary   Patient Name: Stacy Dixon MRN: [id number] DOB:[birth year], 63 y.o., female Today's Date: 08/18/2024  END OF SESSION:  PT End of Session - 08/18/24 1521     Visit Number 2    Number of Visits 17    Date for PT Re-Evaluation 09/24/24    PT Start Time 1521    PT Stop Time 1605    PT Time Calculation (min) 44 min    Activity Tolerance Patient tolerated treatment well    Behavior During Therapy WFL for tasks assessed/performed           Past Medical History:  Diagnosis Date   Allergy    I have seasonal allergies   Anemia    Asthma    Diabetes mellitus without complication (HCC)    GERD (gastroesophageal reflux disease)    Heart murmur    Some question of a history of A.Fib, but on no control meds and patient is in NSR on ED EKG   Hypertension    Past Surgical History:  Procedure Laterality Date   CESAREAN SECTION     CHOLECYSTECTOMY N/A 03/07/2014   Procedure: LAPAROSCOPIC CHOLECYSTECTOMY ;  Surgeon: Dann FORBES Hummer, MD;  Location: [facility] OR;  Service: General;  Laterality: N/A;   COLONOSCOPY WITH PROPOFOL  N/A 04/19/2023   Procedure: COLONOSCOPY WITH PROPOFOL ;  Surgeon: Unk Corinn Skiff, MD;  Location: [facility] ENDOSCOPY;  Service: Gastroenterology;  Laterality: N/A;   ERCP N/A 03/07/2014   Procedure: ENDOSCOPIC RETROGRADE CHOLANGIOPANCREATOGRAPHY (ERCP);  Surgeon: Toribio SHAUNNA Cedar, MD;  Location: [facility] OR;  Service: Endoscopy;  Laterality: N/A;  we are planning to do this at same time as cholecystectomy.    TUBAL LIGATION  12/03/1993   UTERINE FIBROID SURGERY  12/04/2011   Patient Active Problem List   Diagnosis Date Noted   Class 1 obesity with serious comorbidity and body mass index (BMI) of 32.0 to 32.9 in adult 07/22/2024   Cecal polyp 04/19/2023   Uncontrolled type 2 diabetes mellitus with hyperglycemia (HCC) 03/29/2023   Hyperlipidemia associated with type 2 diabetes mellitus (HCC) 03/29/2023    Hypertension associated with diabetes (HCC) 03/06/2014   Asthma with exacerbation 03/06/2014    PCP: Leavy Mole, PA-C  REFERRING PROVIDER: Leavy Mole, PA-C  REFERRING DIAG: [phone] (ICD-10-CM) - Acute left-sided low back pain with left-sided sciatica  Rationale for Evaluation and Treatment: Rehabilitation  THERAPY DIAG:  Radiculopathy, lumbar region  Muscle weakness (generalized)  ONSET DATE: 2 weeks ago   SUBJECTIVE:  SUBJECTIVE STATEMENT: The pain in L LE is gone. Had meloxicam  and cyclobenzaprine  which helped. 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 The shots were about 3 weeks ago. Feels like she can graudate PT today   PERTINENT HISTORY:  Per MD note on 07/22/24, Low back pain with sciatica (left side) onset 2 weeks ago with + left SLR, limited ROM and left lumbar paraspinal tenderness, no improvement with resting, mobic  and muscle relaxers Pain worse with movement and trouble sleeping, sitting does not increase pain Previous similar episodes occurred ten years ago, which resolved with walking and rest, but current episode is more severe and persistent.  PAIN:  Are you having pain? Yes: NPRS scale: 9/10 Pain location: L LE (lateral thigh) Pain description: intense, sharp, shock Aggravating factors: sitting, standing for prolonged periods of time Relieving factors: walking, heating pad   PRECAUTIONS: None  RED FLAGS: None   WEIGHT BEARING RESTRICTIONS: No  FALLS:  Has patient fallen in last 6 months? 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  DIAGNOSTIC FINDINGS:  N/A   PATIENT SURVEYS:  Modified Oswestry:  MODIFIED OSWESTRY DISABILITY SCALE  Date: 07/30/24 Score  Pain intensity 0 = I can tolerate the pain I have without having to use pain medication.  2. Personal care (washing, dressing, etc.) 1 =  I can take care of myself normally, but it increases my pain.  3. Lifting 2 = Pain prevents me from lifting heavy weights off the floor, activities (eg. sports, dancing). but I can manage if the weights are conveniently positioned (3) Pain prevents me from going out very often. (eg, on a table).  4. Walking 1 = Pain prevents me from walking more than 1 mile.  5. Sitting 2 =  Pain prevents me from sitting more than 1 hour.  6. Standing 1 =  I can stand as long as I want but, it increases my pain.  7. Sleeping 2 =  Even when I take pain medication, I sleep less than 6 hours  8. Social Life 1 =  My social life is normal, but it increases my level of pain.  9. Traveling 1 =  I can travel anywhere, but it increases my pain.  10. Employment/ Homemaking 1 = My normal homemaking/job activities increase my pain, but I can still perform all that is required of me  Total 12/50   Interpretation of scores: Score Category Description  0-20% Minimal Disability The patient can cope with most living activities. Usually no treatment is indicated apart from advice on lifting, sitting and exercise  21-40% Moderate Disability The patient experiences more pain and difficulty with sitting, lifting and standing. Travel and social life are more difficult and they may be disabled from work. Personal care, sexual activity and sleeping are not grossly affected, and the patient can usually be managed by conservative means  41-60% Severe Disability Pain remains the main problem in this group, but activities of daily living are affected. These patients require a  detailed investigation  61-80% Crippled Back pain impinges on all aspects of the patient's life. Positive intervention is required  81-100% Bed-bound  These patients are either bed-bound or exaggerating their symptoms  Bluford FORBES Zoe DELENA Karon DELENA, et al. Surgery versus conservative management of stable thoracolumbar fracture: the PRESTO feasibility RCT. [place] [employer]; 2021 Nov. [facility] Technology Assessment, No. 25.62.) Appendix 3, Oswestry Disability Index category descriptors. Available from: FindJewelers.cz  Minimally Clinically Important Difference (MCID) = 12.8%     COGNITION: Overall cognitive status: Within functional limits for tasks assessed     SENSATION: Light touch: Impaired  and L lower leg with impaired light touch   MUSCLE LENGTH: Hamstrings: Right 70 deg; Left 55 deg  POSTURE: rounded shoulders and forward head  PALPATION: TTP to L piriformis   LUMBAR ROM:   AROM eval  Flexion WFL  Extension WFL  Right lateral flexion WFL  Left lateral flexion WFL  Right rotation WFL  Left rotation WFL   (Blank rows = not tested)  LOWER EXTREMITY ROM:     Active  Right eval Left eval  Hip flexion    Hip extension    Hip abduction    Hip adduction    Hip internal rotation    Hip external rotation    Knee flexion    Knee extension    Ankle dorsiflexion    Ankle plantarflexion    Ankle inversion    Ankle eversion     (Blank rows = not tested)  LOWER EXTREMITY MMT:    MMT Right eval Left eval  Hip flexion 4+ 4  Hip extension    Hip abduction 4+ 4  Hip adduction 4+ 4+  Hip internal rotation    Hip external rotation    Knee flexion 4+ 4  Knee extension 4+ 4  Ankle dorsiflexion 4+ 4+  Ankle plantarflexion 4+ 4+  Ankle inversion    Ankle eversion     (Blank rows = not tested)  LUMBAR SPECIAL TESTS:  Straight leg raise test: Positive and FABER test: Negative  FUNCTIONAL TESTS:  5 times sit to  stand: 10.38 seconds 6 minute walk test: NT   GAIT: Distance walked: 50'  Assistive device utilized: None Level of assistance: Complete Independence Comments: decreased stance time on L   TREATMENT DATE: 08/18/24                                                                                                                               Therapeutic activities Performed with the intent on improving ability to perform work tasks while protecting her back.   Ergonomic lifting and transferring box from floor  to and from table on left   9.5 lb box  1x (easy),    Then 29.5 lbs 5x   Then 49.5 lbs 2x  Ergonomic lifting and tranfering box from floor to and from table on right  29.5 lbs 5x   49.5 lbs for 2x   Cues to increase hip and knee flexion as well as to not twist while lifting to protect back  Standing split squat with contralateral UE assist to promote  glute strength for ergonomic lifting  R 10x3  L 10x3  Seated hip adduction isometrics, small blue ball squeeze 10x with 5 second holds, then 5x5 second holds  Standing bent over onto elevate low mat table to promote glute strength for ergonomic lifting  Hip extension    L 5x5 seconds   R 5x5 seconds   PT assist to prevent hip ER compensation for L and R LE  Seated hip IR   L 10x  R 10x    Improved exercise technique, movement at target joints, use of target muscles after mod verbal, visual, tactile cues.      PATIENT EDUCATION:  Education details: HEP, POC, goal of therapy  Person educated: Patient Education method: Explanation, Demonstration, and Handouts Education comprehension: verbalized understanding and returned demonstration  HOME EXERCISE PROGRAM: Access Code: [email or web address] URL: [email or web address] Date: 07/30/2024 Prepared by: Maryanne Finder  Exercises - Seated Piriformis Stretch  - 2-3 x daily - 5-7 x weekly - 3-5 reps - 30-60 seconds hold - Seated Hamstring Stretch  - 2-3 x daily -  5-7 x weekly - 3-5 reps - 30-60 second hold - Standing Piriformis Release with Ball at Wall  - 2 x daily - 5-7 x weekly - 1-2 minutes hold  - Split Squats Forward Trunk  - 1 x daily - 4 x weekly - 3 sets - 10 reps   ASSESSMENT:  CLINICAL IMPRESSION:  Pt demonstrates overall decreased L LE pain, improved function (based on subjective reports), and improved lifting mechanics. Pt has made very good progress with her treatment. Skilled physical therapy services discharged with pt transitioning to her home exercise program.     OBJECTIVE IMPAIRMENTS: decreased activity tolerance, decreased endurance, decreased mobility, difficulty walking, decreased ROM, decreased strength, impaired flexibility, impaired sensation, postural dysfunction, and pain.   ACTIVITY LIMITATIONS: lifting, bending, sitting, squatting, sleeping, and caring for others  PARTICIPATION LIMITATIONS: cleaning, laundry, community activity, and occupation  PERSONAL FACTORS: Age, Behavior pattern, Past/current experiences, and Profession are also affecting patient's functional outcome.   REHAB POTENTIAL: Good  CLINICAL DECISION MAKING: Stable/uncomplicated  EVALUATION COMPLEXITY: Moderate   GOALS: Goals reviewed with patient? Yes  SHORT TERM GOALS: Target date: 08/27/2024  Patient will be independent in HEP to improve strength/mobility for better functional independence with ADLs. Baseline: No questions with her HEP, still doing the exercises (08/18/2024) Goal status: MET   LONG TERM GOALS: Target date: 09/24/2024  Patient will reduce modified Oswestry score to <20 as to demonstrate minimal disability with ADLs including improved sleeping tolerance, walking/sitting tolerance etc for better mobility with ADLs.  Baseline: 8/28: 24%; Pt did not fill out form but based on subjective reports, pt states she is able to do everything at work fine. (08/18/2024) Goal status: POTENTIALLY MET  2.  Patient will report <2/10  radicular pain symptoms in LLE to demonstrate improved ability tolerate daily activities.  Baseline: 0/10 at most for the past 7 days (08/18/2024) Goal status: MET  3.  Patient will demonstrate proper lifting mechanics to simulate job duties to reduce low back strain.  Baseline: Able to demonstrate proper lifting technique today after practice. (08/18/2024) Goal status: MET  PLAN:  PT FREQUENCY: 1-2x/week  PT DURATION: 8 weeks  PLANNED INTERVENTIONS: 97164- PT Re-evaluation, 97750- Physical Performance Testing, 97110-Therapeutic exercises, 97530- Therapeutic activity, 97112- Neuromuscular re-education, 97535- Self Care, [id number]- Manual therapy, [phone]- Gait training, Patient/Family education, Balance training, Stair training, Joint mobilization, Joint manipulation, Spinal manipulation, Spinal mobilization, DME instructions, Cryotherapy, and Moist heat.  PLAN  FOR NEXT SESSION: Lifting mechanics,   Thank you for your referral.  Emil Glassman PT, DPT Physical Therapist - [facility] 08/18/2024, 4:18 PM

## 2024-08-20 ENCOUNTER — Ambulatory Visit

## 2024-08-24 NOTE — Progress Notes (Signed)
 Complex Care Management Note  Care Guide Note 08/24/2024 Name: JORDIN DAMBROSIO MRN: [id number] DOB: May 23, 1961  FINNLEE GUARNIERI is a 63 y.o. year old female who sees Tapia, Leisa, PA-C for primary care. I reached out to Jerel JONETTA Glatter by phone today to offer complex care management services.  Ms. Goding was given information about Complex Care Management services today including:   The Complex Care Management services include support from the care team which includes your Nurse Care Manager, Clinical Social Worker, or Pharmacist.  The Complex Care Management team is here to help remove barriers to the health concerns and goals most important to you. Complex Care Management services are voluntary, and the patient may decline or stop services at any time by request to their care team member.   Complex Care Management Consent Status: Patient did not agree to participate in complex care management services at this time.  Follow up plan:  Patient will follow up with PCP as needed.  Encounter Outcome:  Patient Refused  Dreama Agent [facility], [facility] Assistant Direct Dial: [phone]  Fax: [phone]

## 2024-08-25 ENCOUNTER — Ambulatory Visit

## 2024-08-26 ENCOUNTER — Telehealth: Admitting: Family Medicine

## 2024-08-27 ENCOUNTER — Encounter

## 2024-09-02 ENCOUNTER — Encounter

## 2024-09-04 ENCOUNTER — Encounter

## 2024-09-08 ENCOUNTER — Encounter

## 2024-09-10 ENCOUNTER — Encounter

## 2024-09-10 ENCOUNTER — Ambulatory Visit

## 2024-09-10 DIAGNOSIS — Z794 Long term (current) use of insulin: Secondary | ICD-10-CM

## 2024-09-10 DIAGNOSIS — E1165 Type 2 diabetes mellitus with hyperglycemia: Secondary | ICD-10-CM

## 2024-09-10 NOTE — Progress Notes (Signed)
 S:     Chief Complaint  Patient presents with   Medication Management    Diabetes    Reason for visit: ?  Stacy Dixon is a 63 y.o. female with a history of diabetes (type 2), who presents today for an initial diabetes Face to Face pharmacotherapy visit.? Pertinent PMH also includes HTN, asthma, obesity.  Care Team: Primary Care Provider: Leavy Mole, PA-C  Patient was seen at the [facility] ED on 08/03/24 with nausea and vomiting. Work up was concerning for gastritis. Patient discontinued Ozempic  2 mg weekly at that time.   At last visit with PCP on 08/12/24, patient was instructed to begin administering Lantus  as 15 units in the morning and 17 units in the evening with titration instructions.  She has self-titrated to Lantus  50 units once daily since the last visit.   Patient reports Diabetes was diagnosed about 4 years ago.   Current diabetes medications include: Lantus  50 units in the evening Previous diabetes medications include: Ozempic  (GI distress), metformin (diarrhea), glipizide   Current hypertension medications include: amlodipine  10 mg daily, lisinopril  20/12.5 mg daily  Current hyperlipidemia medications include: rosuvastatin  5 mg daily  Patient reports adherence to taking all medications as prescribed.   Have you been experiencing any side effects to the medications prescribed? no Do you have any problems obtaining medications due to transportation or finances? no Insurance coverage: [place] Medicaid  Patient denies hypoglycemic events.  Reported home fasting blood sugars: 250-300 mg/dL   Patient reported dietary habits: Eats 1 meals/day Dinner: steak, fish, chicken, broccoli, green beans, potatoes   DM Prevention:  Statin: Taking; moderate intensity.?  History of chronic kidney disease? no History of albuminuria? no, last UACR on 07/22/24 = undetectable Last eye exam:  Lab Results  Component Value Date   HMDIABEYEEXA No Retinopathy 07/20/2024   Tobacco Use:   Tobacco Use: Low Risk  (08/12/2024)   Patient History    Smoking Tobacco Use: Never    Smokeless Tobacco Use: Never    Passive Exposure: Not on file   O:  Vitals:  Wt Readings from Last 3 Encounters:  08/12/24 172 lb (78 kg)  07/22/24 180 lb (81.6 kg)  03/31/24 180 lb 6.4 oz (81.8 kg)   BP Readings from Last 3 Encounters:  08/12/24 136/78  07/22/24 132/86  04/10/24 (!) 158/96   Pulse Readings from Last 3 Encounters:  08/12/24 91  07/22/24 98  04/10/24 (!) 108     Labs:?  Lab Results  Component Value Date   HGBA1C 11.7 (H) 07/22/2024   HGBA1C 8.5 (A) 03/31/2024   HGBA1C 9.8 (A) 12/31/2023   GLUCOSE 269 (H) 07/22/2024   MICRALBCREAT NOTE 07/22/2024   CREATININE 0.46 (L) 07/22/2024   CREATININE 0.57 03/31/2024   CREATININE 0.63 09/30/2023   GFR 97.34 03/31/2024   GFR 95.35 09/30/2023   GFR 56.70 (L) 03/29/2023    Lab Results  Component Value Date   CHOL 251 (H) 07/22/2024   LDLCALC 171 (H) 07/22/2024   HDL 60 07/22/2024   TRIG 90 07/22/2024   ALT 32 (H) 07/22/2024   ALT 39 (H) 09/30/2023   AST 21 07/22/2024   AST 27 09/30/2023      Chemistry      Component Value Date/Time   NA 139 07/22/2024 1157   NA 135 (L) 03/05/2014 2221   K 4.7 07/22/2024 1157   K 3.8 03/05/2014 2221   CL 102 07/22/2024 1157   CL 102 03/05/2014 2221   CO2 28  07/22/2024 1157   CO2 26 03/05/2014 2221   BUN 7 07/22/2024 1157   BUN 6 (L) 03/05/2014 2221   CREATININE 0.46 (L) 07/22/2024 1157      Component Value Date/Time   CALCIUM  10.0 07/22/2024 1157   CALCIUM  9.3 03/05/2014 2221   ALKPHOS 116 09/30/2023 1205   ALKPHOS 251 (H) 03/05/2014 2221   AST 21 07/22/2024 1157   AST 823 (H) 03/05/2014 2221   ALT 32 (H) 07/22/2024 1157   ALT 1,105 (H) 03/05/2014 2221   BILITOT 0.4 07/22/2024 1157   BILITOT 4.1 (H) 03/05/2014 2221       The 10-year ASCVD risk score (Arnett DK, et al., 2019) is: 23.6%  Lab Results  Component Value Date   MICRALBCREAT NOTE 07/22/2024     A/P: Diabetes currently uncontrolled with a most recent A1c of 11.7% on 07/22/24, which is up from 8.5% on 03/31/24. Patient is able to verbalize appropriate hypoglycemia management plan. Medication adherence appears appropriate. Fasting BG readings remain elevated at 250-300 mg/dL. Given patient concerns for GI distress with GLP1 therapy, will hold of on discussing re-initiation until next visit. May discuss trialing to Trulicity at that time. Patient denied GI distress with lower doses of Ozempic . Current basal insulin  dose is 0.64 units/kg/day; concern for overbasalization. Patient eats only dinner. Will start low-dose Humalog and CGM at this time.  -Continued basal insulin  Lantus  (insulin  glargine)  50 units daily. Counseled against further self-titration.  -Started rapid insulin  Humalog (insulin  lispro) 5 units once daily with dinner.  -Patient educated on purpose, proper use, and potential adverse effects of Humalog.  -Extensively discussed pathophysiology of diabetes, recommended lifestyle interventions, dietary effects on blood sugar control.  -Counseled on s/sx of and management of hypoglycemia.  -Next A1c anticipated 10/2024.  -Placed Freestyle Libre 3 plus in clinic and provided reader. Will set up on smart phone at follow up when she is able to login to her [employer] store  ASCVD risk - primary prevention in patient with diabetes. Last LDL is 171 mg/dL, not at goal of <29 mg/dL. Last LDL was before statin therapy was initiated. Will get updated lipid panel at follow up.  -Continued rosuvastatin  5 mg daily.    Patient verbalized understanding of treatment plan. Total time patient counseling 30 minutes.  Follow-up:  Pharmacist on 10/08/24 PCP clinic visit on 10/08/24 Endocrinology on 09/23/24  Peyton CHARLENA Ferries, PharmD Clinical Pharmacist [facility] [phone]

## 2024-09-14 MED ORDER — INSULIN LISPRO (1 UNIT DIAL) 100 UNIT/ML (KWIKPEN): PEN_INJECTOR | SUBCUTANEOUS | 1 refills | Status: AC

## 2024-09-15 ENCOUNTER — Encounter

## 2024-09-17 ENCOUNTER — Encounter

## 2024-09-23 ENCOUNTER — Encounter

## 2024-09-23 DIAGNOSIS — I152 Hypertension secondary to endocrine disorders: Secondary | ICD-10-CM | POA: Diagnosis not present

## 2024-09-23 DIAGNOSIS — E1140 Type 2 diabetes mellitus with diabetic neuropathy, unspecified: Secondary | ICD-10-CM | POA: Diagnosis not present

## 2024-09-23 DIAGNOSIS — E669 Obesity, unspecified: Secondary | ICD-10-CM | POA: Diagnosis not present

## 2024-09-23 DIAGNOSIS — E1165 Type 2 diabetes mellitus with hyperglycemia: Secondary | ICD-10-CM | POA: Diagnosis not present

## 2024-09-23 DIAGNOSIS — Z794 Long term (current) use of insulin: Secondary | ICD-10-CM | POA: Diagnosis not present

## 2024-09-23 DIAGNOSIS — E119 Type 2 diabetes mellitus without complications: Secondary | ICD-10-CM | POA: Diagnosis not present

## 2024-09-23 DIAGNOSIS — E1169 Type 2 diabetes mellitus with other specified complication: Secondary | ICD-10-CM | POA: Diagnosis not present

## 2024-09-23 DIAGNOSIS — E1159 Type 2 diabetes mellitus with other circulatory complications: Secondary | ICD-10-CM | POA: Diagnosis not present

## 2024-09-23 DIAGNOSIS — E785 Hyperlipidemia, unspecified: Secondary | ICD-10-CM | POA: Diagnosis not present

## 2024-09-25 ENCOUNTER — Encounter

## 2024-09-30 ENCOUNTER — Encounter

## 2024-10-02 ENCOUNTER — Encounter

## 2024-10-06 ENCOUNTER — Encounter

## 2024-10-07 ENCOUNTER — Other Ambulatory Visit: Payer: Self-pay | Admitting: Nurse Practitioner

## 2024-10-07 DIAGNOSIS — E1165 Type 2 diabetes mellitus with hyperglycemia: Secondary | ICD-10-CM

## 2024-10-08 ENCOUNTER — Ambulatory Visit

## 2024-10-08 ENCOUNTER — Ambulatory Visit: Admitting: Nurse Practitioner

## 2024-10-08 ENCOUNTER — Encounter: Payer: Self-pay | Admitting: Nurse Practitioner

## 2024-10-08 VITALS — BP 132/80 | HR 98 | Temp 98.0°F | Ht 62.0 in | Wt 177.0 lb

## 2024-10-08 DIAGNOSIS — E1165 Type 2 diabetes mellitus with hyperglycemia: Secondary | ICD-10-CM

## 2024-10-08 DIAGNOSIS — I152 Hypertension secondary to endocrine disorders: Secondary | ICD-10-CM | POA: Diagnosis not present

## 2024-10-08 DIAGNOSIS — Z794 Long term (current) use of insulin: Secondary | ICD-10-CM

## 2024-10-08 DIAGNOSIS — E1159 Type 2 diabetes mellitus with other circulatory complications: Secondary | ICD-10-CM

## 2024-10-08 DIAGNOSIS — J4521 Mild intermittent asthma with (acute) exacerbation: Secondary | ICD-10-CM | POA: Diagnosis not present

## 2024-10-08 DIAGNOSIS — Z6832 Body mass index (BMI) 32.0-32.9, adult: Secondary | ICD-10-CM

## 2024-10-08 DIAGNOSIS — E785 Hyperlipidemia, unspecified: Secondary | ICD-10-CM | POA: Diagnosis not present

## 2024-10-08 DIAGNOSIS — E66811 Obesity, class 1: Secondary | ICD-10-CM

## 2024-10-08 DIAGNOSIS — E1169 Type 2 diabetes mellitus with other specified complication: Secondary | ICD-10-CM | POA: Diagnosis not present

## 2024-10-08 NOTE — Progress Notes (Signed)
 BP 132/80   Pulse 98   Temp 98 F (36.7 C)   Ht 5' 2 (1.575 m)   Wt 177 lb (80.3 kg)   LMP 06/01/2016 (Approximate)   SpO2 99%   BMI 32.37 kg/m    Subjective:    Patient ID: Stacy Dixon, female    DOB: 01-05-1961, 63 y.o.   MRN: [id number]  HPI: Stacy Dixon is a 63 y.o. female  Chief Complaint  Patient presents with   Medical Management of Chronic Issues   Discussed the use of AI scribe software for clinical note transcription with the patient, who gave verbal consent to proceed.  History of Present Illness Stacy Dixon is a 63 year old female with hypertension who presents for blood pressure management.  Hypertension - Blood pressure usually well-controlled at home with amlodipine  10 mg daily and lisinopril -hydrochlorothiazide  20-12.5 mg daily - Elevated blood pressure reading today attributed to emotional stress from a minor argument prior to the visit  Diabetes mellitus type 2-managed by endocrinology - Uncontrolled diabetes on insulin  therapy - Endocrinology consultation on September 23, 2024 - Humalog 5 units before each meal, administered 15 minutes prior to eating - Lantus  50 units at bedtime - Last hemoglobin A1c on July 22, 2024 was 11.7% - Endocrinology follow-up scheduled for November 06, 2024 -up to date on DM eye exam, foot exam and microalbumin urine -on statin and ace  Hyperlipidemia - Hyperlipidemia associated with diabetes - Rosuvastatin  5 mg daily - Last lipid panel showed LDL 171 mg/dL  Asthma - Uses Ventolin  as needed for asthma symptoms -stable        07/22/2024   10:24 AM 03/31/2024   11:58 AM 12/31/2023   11:44 AM  Depression screen PHQ 2/9  Decreased Interest 0 0 0  Down, Depressed, Hopeless 0 0 0  PHQ - 2 Score 0 0 0  Altered sleeping 0 0 0  Tired, decreased energy 0 0 0  Change in appetite 0 0 0  Feeling bad or failure about yourself  0 0 0  Trouble concentrating 0 0 0  Moving slowly or fidgety/restless 0 0 0   Suicidal thoughts 0 0 0  PHQ-9 Score 0  0  0   Difficult doing work/chores Not difficult at all Not difficult at all Not difficult at all     Data saved with a previous flowsheet row definition    Relevant past medical, surgical, family and social history reviewed and updated as indicated. Interim medical history since our last visit reviewed. Allergies and medications reviewed and updated.  Review of Systems  Constitutional: Negative for fever or weight change.  Respiratory: Negative for cough and shortness of breath.   Cardiovascular: Negative for chest pain or palpitations.  Gastrointestinal: Negative for abdominal pain, no bowel changes.  Musculoskeletal: Negative for gait problem or joint swelling.  Skin: Negative for rash.  Neurological: Negative for dizziness or headache.  No other specific complaints in a complete review of systems (except as listed in HPI above).      Objective:      BP 132/80   Pulse 98   Temp 98 F (36.7 C)   Ht 5' 2 (1.575 m)   Wt 177 lb (80.3 kg)   LMP 06/01/2016 (Approximate)   SpO2 99%   BMI 32.37 kg/m    Wt Readings from Last 3 Encounters:  10/08/24 177 lb (80.3 kg)  08/12/24 172 lb (78 kg)  07/22/24 180 lb (81.6 kg)  Physical Exam VITALS: BP- 138/98 GENERAL: Alert, cooperative, well developed, no acute distress. HEENT: Normocephalic, normal oropharynx, moist mucous membranes. CHEST: Clear to auscultation bilaterally, no wheezes, rhonchi, or crackles. CARDIOVASCULAR: Normal heart rate and rhythm, S1 and S2 normal without murmurs. ABDOMEN: Soft, non-tender, non-distended, without organomegaly, normal bowel sounds. EXTREMITIES: No cyanosis or edema. NEUROLOGICAL: Cranial nerves grossly intact, moves all extremities without gross motor or sensory deficit.  Results for orders placed or performed in visit on 07/23/24  HM DIABETES EYE EXAM   Collection Time: 07/20/24 10:25 AM  Result Value Ref Range   HM Diabetic Eye Exam No  Retinopathy No Retinopathy          Assessment & Plan:   Problem List Items Addressed This Visit       Cardiovascular and Mediastinum   Hypertension associated with diabetes (HCC) - Primary   Relevant Medications   GVOKE HYPOPEN  2-PACK 1 MG/0.2ML SOAJ     Respiratory   Asthma with exacerbation     Endocrine   Uncontrolled type 2 diabetes mellitus with hyperglycemia (HCC)   Relevant Medications   GVOKE HYPOPEN  2-PACK 1 MG/0.2ML SOAJ   Hyperlipidemia associated with type 2 diabetes mellitus (HCC)   Relevant Medications   GVOKE HYPOPEN  2-PACK 1 MG/0.2ML SOAJ     Other   Class 1 obesity with serious comorbidity and body mass index (BMI) of 32.0 to 32.9 in adult   Relevant Medications   GVOKE HYPOPEN  2-PACK 1 MG/0.2ML SOAJ     Assessment and Plan Assessment & Plan Type 2 diabetes mellitus, uncontrolled Uncontrolled type 2 diabetes mellitus with a recent A1c of 11.7% as of August 20th, 2025. Currently managed with Lantus  and Humalog. Recent endocrinology visit resulted in adjustment of Humalog to 5 units before each meal, taken 15 minutes prior to meals. Continues Lantus  50 units at bedtime. - Continue Lantus  50 units at bedtime. - Continue Humalog 5 units before each meal, taken 15 minutes prior to meals. - Follow up with endocrinology on December 5th, 2025.  Hypertension Elevated blood pressure today at 138/98 mmHg. Currently managed with amlodipine  10 mg daily and lisinopril -hydrochlorothiazide  20-12.5 mg daily. Reports elevated blood pressure due to stress from an argument, but normal readings at home. - Rechecked blood pressure after she calmed down. - Continue current antihypertensive regimen. BP Readings from Last 3 Encounters:  10/08/24 132/80  08/12/24 136/78  07/22/24 132/86      Hyperlipidemia-continue rosuvastatin  5 mg daily Lipid Panel     Component Value Date/Time   CHOL 251 (H) 07/22/2024 1157   TRIG 90 07/22/2024 1157   HDL 60 07/22/2024 1157    CHOLHDL 4.2 07/22/2024 1157   LDLCALC 171 (H) 07/22/2024 1157    General Health Maintenance No new concerns or issues reported. Regular follow-up with endocrinology and pharmacy appointments scheduled. - Continue regular follow-up appointments with endocrinology and pharmacy.  Obesity Flowsheet Row Office Visit from 10/08/2024 in [employer] [facility]  1 40 inches   Body mass index is 32.37 kg/m.  Wt Readings from Last 3 Encounters:  10/08/24 177 lb (80.3 kg)  08/12/24 172 lb (78 kg)  07/22/24 180 lb (81.6 kg)    - Encourage continuation of lifestyle modifications, including dietary management and regular exercise. -continue to increase physical activity, getting at least 150 min of physical activity a week.  Work on including runner, broadcasting/film/video 2 days a week.  - continue eating at a calorie deficit 1600-1700 cal a day, eating a well balanced diet  with whole foods, avoiding processed foods.   Patient is motivated to continue working on lifestyle modification.     Recently had labs done    Follow up plan: Return in about 4 months (around 02/05/2025) for follow up.

## 2024-10-08 NOTE — Progress Notes (Signed)
 S:    Reason for visit: ?  Stacy Dixon is a 63 y.o. female with a history of diabetes (type 2), who presents today for a follow up diabetes Face to Face pharmacotherapy visit.? Pertinent PMH also includes HTN, asthma, obesity.  Care Team: Primary Care Provider: Gareth Clarity  At last visit with PCP on 08/12/24, patient was instructed to begin administering Lantus  as 15 units in the morning and 17 units in the evening with titration instructions.  At last visit with clinical pharmacist on 09/10/24, patient had self-titrated Lantus  to 50 units once daily. Patient was started on Humalog 5 units once daily in the evening and Freestyle Libre 3+ was placed in clinic.   At initial visit with endocrinology on 09/23/24, Humalog was increased to 5 units TID.  Today, patient reports her CGM fell off and she has not been using it since her last visit with endocrinology. She also reports not monitoring her BG via fingersticks since her visit with endocrinology.   Patient reports Diabetes was diagnosed about 4 years ago.   Current diabetes medications include: Lantus  50 units in the evening, Humalog 5 units TID with meals Previous diabetes medications include: Ozempic  (GI distress), metformin (diarrhea), glipizide   Current hypertension medications include: amlodipine  10 mg daily, lisinopril /hydrochlorothiazide   20/12.5 mg daily  Current hyperlipidemia medications include: rosuvastatin  5 mg daily  Patient reports adherence to taking all medications as prescribed.   Have you been experiencing any side effects to the medications prescribed? no Do you have any problems obtaining medications due to transportation or finances? no Insurance coverage: [place] Medicaid  Patient denies hypoglycemic events.  Patient reported dietary habits: Eats 1 meals/day Dinner: steak, fish, chicken, broccoli, green beans, potatoes   DM Prevention:  Statin: Taking; moderate intensity.?  History of chronic kidney  disease? no History of albuminuria? no, last UACR on 07/22/24 = undetectable Last eye exam:  Lab Results  Component Value Date   HMDIABEYEEXA No Retinopathy 07/20/2024   Tobacco Use:  Tobacco Use: Low Risk  (09/23/2024)   Received from [facility]   Patient History    Smoking Tobacco Use: Never    Smokeless Tobacco Use: Never    Passive Exposure: Not on file   O:  Vitals:  Wt Readings from Last 3 Encounters:  08/12/24 172 lb (78 kg)  07/22/24 180 lb (81.6 kg)  03/31/24 180 lb 6.4 oz (81.8 kg)   BP Readings from Last 3 Encounters:  08/12/24 136/78  07/22/24 132/86  04/10/24 (!) 158/96   Pulse Readings from Last 3 Encounters:  08/12/24 91  07/22/24 98  04/10/24 (!) 108     Labs:?  Lab Results  Component Value Date   HGBA1C 11.7 (H) 07/22/2024   HGBA1C 8.5 (A) 03/31/2024   HGBA1C 9.8 (A) 12/31/2023   GLUCOSE 269 (H) 07/22/2024   MICRALBCREAT NOTE 07/22/2024   CREATININE 0.46 (L) 07/22/2024   CREATININE 0.57 03/31/2024   CREATININE 0.63 09/30/2023   GFR 97.34 03/31/2024   GFR 95.35 09/30/2023   GFR 56.70 (L) 03/29/2023    Lab Results  Component Value Date   CHOL 251 (H) 07/22/2024   LDLCALC 171 (H) 07/22/2024   HDL 60 07/22/2024   TRIG 90 07/22/2024   ALT 32 (H) 07/22/2024   ALT 39 (H) 09/30/2023   AST 21 07/22/2024   AST 27 09/30/2023      Chemistry      Component Value Date/Time   NA 139 07/22/2024 1157  NA 135 (L) 03/05/2014 2221   K 4.7 07/22/2024 1157   K 3.8 03/05/2014 2221   CL 102 07/22/2024 1157   CL 102 03/05/2014 2221   CO2 28 07/22/2024 1157   CO2 26 03/05/2014 2221   BUN 7 07/22/2024 1157   BUN 6 (L) 03/05/2014 2221   CREATININE 0.46 (L) 07/22/2024 1157      Component Value Date/Time   CALCIUM  10.0 07/22/2024 1157   CALCIUM  9.3 03/05/2014 2221   ALKPHOS 116 09/30/2023 1205   ALKPHOS 251 (H) 03/05/2014 2221   AST 21 07/22/2024 1157   AST 823 (H) 03/05/2014 2221   ALT 32 (H) 07/22/2024 1157   ALT 1,105 (H)  03/05/2014 2221   BILITOT 0.4 07/22/2024 1157   BILITOT 4.1 (H) 03/05/2014 2221       The 10-year ASCVD risk score (Arnett DK, et al., 2019) is: 31%  Lab Results  Component Value Date   MICRALBCREAT NOTE 07/22/2024    A/P: Diabetes currently uncontrolled with a most recent A1c of 11.7% on 07/22/24, which is up from 8.5% on 03/31/24. Patient is able to verbalize appropriate hypoglycemia management plan. Medication adherence appears appropriate. Unable to fully assess DM control as she has not been using the CGM since visit with endocrinology. Continued current regimen at this time. -Continued basal insulin  Lantus  (insulin  glargine)  50 units daily.  -Continued rapid insulin  Humalog (insulin  lispro) 5 units TID -Patient educated on purpose, proper use, and potential adverse effects of Humalog.  -Extensively discussed pathophysiology of diabetes, recommended lifestyle interventions, dietary effects on blood sugar control.  -Counseled on s/sx of and management of hypoglycemia.  -Next A1c anticipated 10/2024.  -Replaced Freestyle Libre 3 plus in clinic and set up with reader.  ASCVD risk - primary prevention in patient with diabetes. Last LDL is 171 mg/dL, not at goal of <29 mg/dL. Last LDL was before statin therapy was initiated. Will get updated lipid panel at follow up.  -Continued rosuvastatin  5 mg daily.    Patient verbalized understanding of treatment plan. Total time patient counseling 30 minutes.  Follow-up:  Pharmacist on 12/10/24 Endocrinology on 11/06/24  Peyton CHARLENA Ferries, PharmD, CPP Clinical Pharmacist [facility] [phone]

## 2024-12-10 ENCOUNTER — Ambulatory Visit (INDEPENDENT_AMBULATORY_CARE_PROVIDER_SITE_OTHER)

## 2024-12-10 DIAGNOSIS — Z794 Long term (current) use of insulin: Secondary | ICD-10-CM

## 2024-12-10 NOTE — Progress Notes (Signed)
 "  S:    Reason for visit: ?  Stacy Dixon is a 64 y.o. female with a history of diabetes (type 2), who presents today for a follow up diabetes Face to Face pharmacotherapy visit.? Pertinent PMH also includes HTN, asthma, obesity.  They were referred to the pharmacist by their PCP for assistance in managing diabetes.   Care Team: Primary Care Provider: Gareth Clarity  At initial visit with endocrinology on 12/04/24, patient was instructed to increase Humalog  from 5 to 10 units daily d/t post-prandial hyperglycemia.   Today, she reports she has not been wearing her CGM d/t sensor errors and has not been monitoring BG.  Patient reports Diabetes was diagnosed about 4 years ago.   Current diabetes medications include: Lantus  50 units in the evening, Humalog  10 units TID with meals Previous diabetes medications include: Ozempic  (GI distress), metformin (diarrhea), glipizide   Current hypertension medications include: amlodipine  10 mg daily, lisinopril /hydrochlorothiazide   20/12.5 mg daily  Current hyperlipidemia medications include: rosuvastatin  5 mg daily  Patient reports adherence to taking all medications as prescribed.   Have you been experiencing any side effects to the medications prescribed? no Do you have any problems obtaining medications due to transportation or finances? no Insurance coverage: [place] Medicaid  Patient denies hypoglycemic symptoms  Patient reported dietary habits: Eats 1 meals/day Dinner: steak, fish, chicken, broccoli, green beans, potatoes   DM Prevention:  Statin: Taking; moderate intensity.?  ACE/ARB: yes; lisinopril /hydrochlorothiazide   Last urinary albumin/creatinine ratio:  Lab Results  Component Value Date   MICRALBCREAT NOTE 07/22/2024   Last eye exam:  Lab Results  Component Value Date   HMDIABEYEEXA No Retinopathy 07/20/2024   Lab Results  Component Value Date   HMDIABEYEEXA No Retinopathy 07/20/2024   Last foot exam: 08/12/2024 Tobacco  Use:  Tobacco Use: Low Risk  (12/04/2024)   Received from [facility]   Patient History    Smoking Tobacco Use: Never    Smokeless Tobacco Use: Never    Passive Exposure: Not on file     O:  Vitals:  Wt Readings from Last 3 Encounters:  10/08/24 177 lb (80.3 kg)  08/12/24 172 lb (78 kg)  07/22/24 180 lb (81.6 kg)   BP Readings from Last 3 Encounters:  10/08/24 132/80  08/12/24 136/78  07/22/24 132/86   Pulse Readings from Last 3 Encounters:  10/08/24 98  08/12/24 91  07/22/24 98     Labs:?  Lab Results  Component Value Date   HGBA1C 11.7 (H) 07/22/2024   HGBA1C 8.5 (A) 03/31/2024   HGBA1C 9.8 (A) 12/31/2023   GLUCOSE 269 (H) 07/22/2024   MICRALBCREAT NOTE 07/22/2024   CREATININE 0.46 (L) 07/22/2024   CREATININE 0.57 03/31/2024   CREATININE 0.63 09/30/2023   GFR 97.34 03/31/2024   GFR 95.35 09/30/2023   GFR 56.70 (L) 03/29/2023    Lab Results  Component Value Date   CHOL 251 (H) 07/22/2024   LDLCALC 171 (H) 07/22/2024   HDL 60 07/22/2024   TRIG 90 07/22/2024   ALT 32 (H) 07/22/2024   ALT 39 (H) 09/30/2023   AST 21 07/22/2024   AST 27 09/30/2023      Chemistry      Component Value Date/Time   NA 139 07/22/2024 1157   NA 135 (L) 03/05/2014 2221   K 4.7 07/22/2024 1157   K 3.8 03/05/2014 2221   CL 102 07/22/2024 1157   CL 102 03/05/2014 2221   CO2 28 07/22/2024 1157  CO2 26 03/05/2014 2221   BUN 7 07/22/2024 1157   BUN 6 (L) 03/05/2014 2221   CREATININE 0.46 (L) 07/22/2024 1157      Component Value Date/Time   CALCIUM  10.0 07/22/2024 1157   CALCIUM  9.3 03/05/2014 2221   ALKPHOS 116 09/30/2023 1205   ALKPHOS 251 (H) 03/05/2014 2221   AST 21 07/22/2024 1157   AST 823 (H) 03/05/2014 2221   ALT 32 (H) 07/22/2024 1157   ALT 1,105 (H) 03/05/2014 2221   BILITOT 0.4 07/22/2024 1157   BILITOT 4.1 (H) 03/05/2014 2221       The 10-year ASCVD risk score (Arnett DK, et al., 2019) is: 24.9%  Lab Results  Component Value Date    MICRALBCREAT NOTE 07/22/2024    A/P: Diabetes currently uncontrolled with a most recent A1c of 11.9% on 12/04/24.  Patient is able to verbalize appropriate hypoglycemia management plan. Medication adherence appears appropriate. Unable to fully assess DM control as she has not been using the CGM since visit with endocrinology. Continued current regimen at this time. -Continued basal insulin  Lantus  (insulin  glargine)  50 units daily.  -Continued rapid insulin  Humalog  (insulin  lispro) 10 units TID. Counseled to only take if she eats -Patient educated on purpose, proper use, and potential adverse effects of Humalog .  -Extensively discussed pathophysiology of diabetes, recommended lifestyle interventions, dietary effects on blood sugar control.  -Counseled on s/sx of and management of hypoglycemia.  -Next A1c anticipated 10/2024.  -Replaced Freestyle Libre 3 plus in clinic and set up with phone.  ASCVD risk - primary prevention in patient with diabetes. Last LDL is 171 mg/dL, not at goal of <29 mg/dL. Last LDL was before statin therapy was initiated. Will get updated lipid panel at follow up.  -Continued rosuvastatin  5 mg daily.    Patient verbalized understanding of treatment plan. Total time patient counseling 30 minutes.  Follow-up:  Pharmacist on 12/10/24 Endocrinology on 11/06/24  Peyton CHARLENA Ferries, PharmD, CPP Clinical Pharmacist [facility] [phone]   "

## 2024-12-25 ENCOUNTER — Telehealth: Payer: Self-pay | Admitting: Pharmacy Technician

## 2024-12-25 ENCOUNTER — Other Ambulatory Visit (HOSPITAL_COMMUNITY): Payer: Self-pay

## 2024-12-25 NOTE — Telephone Encounter (Signed)
 Pharmacy Patient Advocate Encounter   Received notification from CoverMyMeds that prior authorization for FreeStyle Libre 3 Plus Sensor is due for renewal.   Insurance verification completed.   The patient is insured through [facility] MEDICAID.  Action: PA required; PA started via CoverMyMeds. KEY [id number] . Waiting for clinical questions to populate.

## 2024-12-25 NOTE — Telephone Encounter (Signed)
 Pharmacy Patient Advocate Encounter  Received notification from [employer] MEDICA[employer] that Prior Authorization for FreeStyle Libre 3 Plus Sensor has been APPROVED from 12/25/24 to 12/25/25. Unable to obtain price due to refill too soon rejection, last fill date 12/10/24 next available fill date01/25/26   PA #/Case ID/Reference #: [id number]

## 2024-12-28 ENCOUNTER — Other Ambulatory Visit (HOSPITAL_COMMUNITY): Payer: Self-pay

## 2025-01-07 ENCOUNTER — Ambulatory Visit

## 2025-01-11 ENCOUNTER — Ambulatory Visit
